# Patient Record
Sex: Female | Born: 1975 | Race: White | Hispanic: No | Marital: Married | Smoking: Former smoker
Health system: Southern US, Community
[De-identification: ages and names within clinical notes are randomized; demographics above are authoritative.]

## PROBLEM LIST (undated history)

## (undated) DIAGNOSIS — T7840XA Allergy, unspecified, initial encounter: Secondary | ICD-10-CM

## (undated) DIAGNOSIS — F419 Anxiety disorder, unspecified: Secondary | ICD-10-CM

## (undated) DIAGNOSIS — K219 Gastro-esophageal reflux disease without esophagitis: Secondary | ICD-10-CM

## (undated) HISTORY — PX: FRACTURE SURGERY: SHX138

## (undated) HISTORY — PX: LITHOTRIPSY: SUR834

## (undated) HISTORY — PX: AUGMENTATION MAMMAPLASTY: SUR837

## (undated) HISTORY — DX: Allergy, unspecified, initial encounter: T78.40XA

## (undated) HISTORY — PX: TONSILLECTOMY: SUR1361

## (undated) HISTORY — DX: Gastro-esophageal reflux disease without esophagitis: K21.9

---

## 2000-01-11 ENCOUNTER — Encounter: Payer: Self-pay | Admitting: Emergency Medicine

## 2000-01-11 ENCOUNTER — Emergency Department (HOSPITAL_COMMUNITY): Admission: EM | Admit: 2000-01-11 | Discharge: 2000-01-11 | Payer: Self-pay | Admitting: Emergency Medicine

## 2000-12-23 ENCOUNTER — Other Ambulatory Visit: Admission: RE | Admit: 2000-12-23 | Discharge: 2000-12-23 | Payer: Self-pay | Admitting: Obstetrics and Gynecology

## 2001-07-20 ENCOUNTER — Inpatient Hospital Stay (HOSPITAL_COMMUNITY): Admission: AD | Admit: 2001-07-20 | Discharge: 2001-07-23 | Payer: Self-pay | Admitting: Obstetrics and Gynecology

## 2002-04-21 ENCOUNTER — Other Ambulatory Visit: Admission: RE | Admit: 2002-04-21 | Discharge: 2002-04-21 | Payer: Self-pay | Admitting: Obstetrics and Gynecology

## 2003-04-03 ENCOUNTER — Other Ambulatory Visit: Admission: RE | Admit: 2003-04-03 | Discharge: 2003-04-03 | Payer: Self-pay | Admitting: Obstetrics and Gynecology

## 2004-04-03 ENCOUNTER — Other Ambulatory Visit: Admission: RE | Admit: 2004-04-03 | Discharge: 2004-04-03 | Payer: Self-pay | Admitting: Obstetrics and Gynecology

## 2004-10-22 ENCOUNTER — Inpatient Hospital Stay (HOSPITAL_COMMUNITY): Admission: AD | Admit: 2004-10-22 | Discharge: 2004-10-22 | Payer: Self-pay | Admitting: Obstetrics and Gynecology

## 2005-05-01 ENCOUNTER — Inpatient Hospital Stay (HOSPITAL_COMMUNITY): Admission: AD | Admit: 2005-05-01 | Discharge: 2005-05-03 | Payer: Self-pay | Admitting: Obstetrics and Gynecology

## 2005-06-10 ENCOUNTER — Other Ambulatory Visit: Admission: RE | Admit: 2005-06-10 | Discharge: 2005-06-10 | Payer: Self-pay | Admitting: Obstetrics and Gynecology

## 2006-03-20 ENCOUNTER — Emergency Department (HOSPITAL_COMMUNITY): Admission: EM | Admit: 2006-03-20 | Discharge: 2006-03-21 | Payer: Self-pay | Admitting: Emergency Medicine

## 2006-06-16 ENCOUNTER — Other Ambulatory Visit: Admission: RE | Admit: 2006-06-16 | Discharge: 2006-06-16 | Payer: Self-pay | Admitting: Obstetrics and Gynecology

## 2007-02-16 ENCOUNTER — Inpatient Hospital Stay (HOSPITAL_COMMUNITY): Admission: AD | Admit: 2007-02-16 | Discharge: 2007-02-16 | Payer: Self-pay | Admitting: Obstetrics and Gynecology

## 2007-06-07 ENCOUNTER — Encounter (INDEPENDENT_AMBULATORY_CARE_PROVIDER_SITE_OTHER): Payer: Self-pay | Admitting: Obstetrics and Gynecology

## 2007-06-07 ENCOUNTER — Inpatient Hospital Stay (HOSPITAL_COMMUNITY): Admission: AD | Admit: 2007-06-07 | Discharge: 2007-06-09 | Payer: Self-pay | Admitting: Obstetrics and Gynecology

## 2009-06-23 ENCOUNTER — Emergency Department (HOSPITAL_COMMUNITY): Admission: EM | Admit: 2009-06-23 | Discharge: 2009-06-23 | Payer: Self-pay | Admitting: Emergency Medicine

## 2009-09-07 ENCOUNTER — Emergency Department (HOSPITAL_COMMUNITY): Admission: EM | Admit: 2009-09-07 | Discharge: 2009-09-08 | Payer: Self-pay | Admitting: Emergency Medicine

## 2009-09-13 ENCOUNTER — Ambulatory Visit (HOSPITAL_COMMUNITY): Admission: RE | Admit: 2009-09-13 | Discharge: 2009-09-13 | Payer: Self-pay | Admitting: Urology

## 2010-01-21 ENCOUNTER — Emergency Department (HOSPITAL_COMMUNITY): Admission: EM | Admit: 2010-01-21 | Discharge: 2010-01-21 | Payer: Self-pay | Admitting: Family Medicine

## 2010-02-13 ENCOUNTER — Emergency Department (HOSPITAL_COMMUNITY): Admission: EM | Admit: 2010-02-13 | Discharge: 2010-02-13 | Payer: Self-pay | Admitting: Family Medicine

## 2010-04-27 ENCOUNTER — Emergency Department (HOSPITAL_COMMUNITY): Admission: EM | Admit: 2010-04-27 | Discharge: 2010-04-27 | Payer: Self-pay | Admitting: Emergency Medicine

## 2010-11-07 IMAGING — CR DG ABDOMEN 1V
1 series · 1 of 1 positions shown · non-contrast
Comparison: CT 09/08/2009

CLINICAL DATA: Pre lithotripsy.  Left renal stone.

ABDOMEN - 1 VIEW

[t abdomen supine]
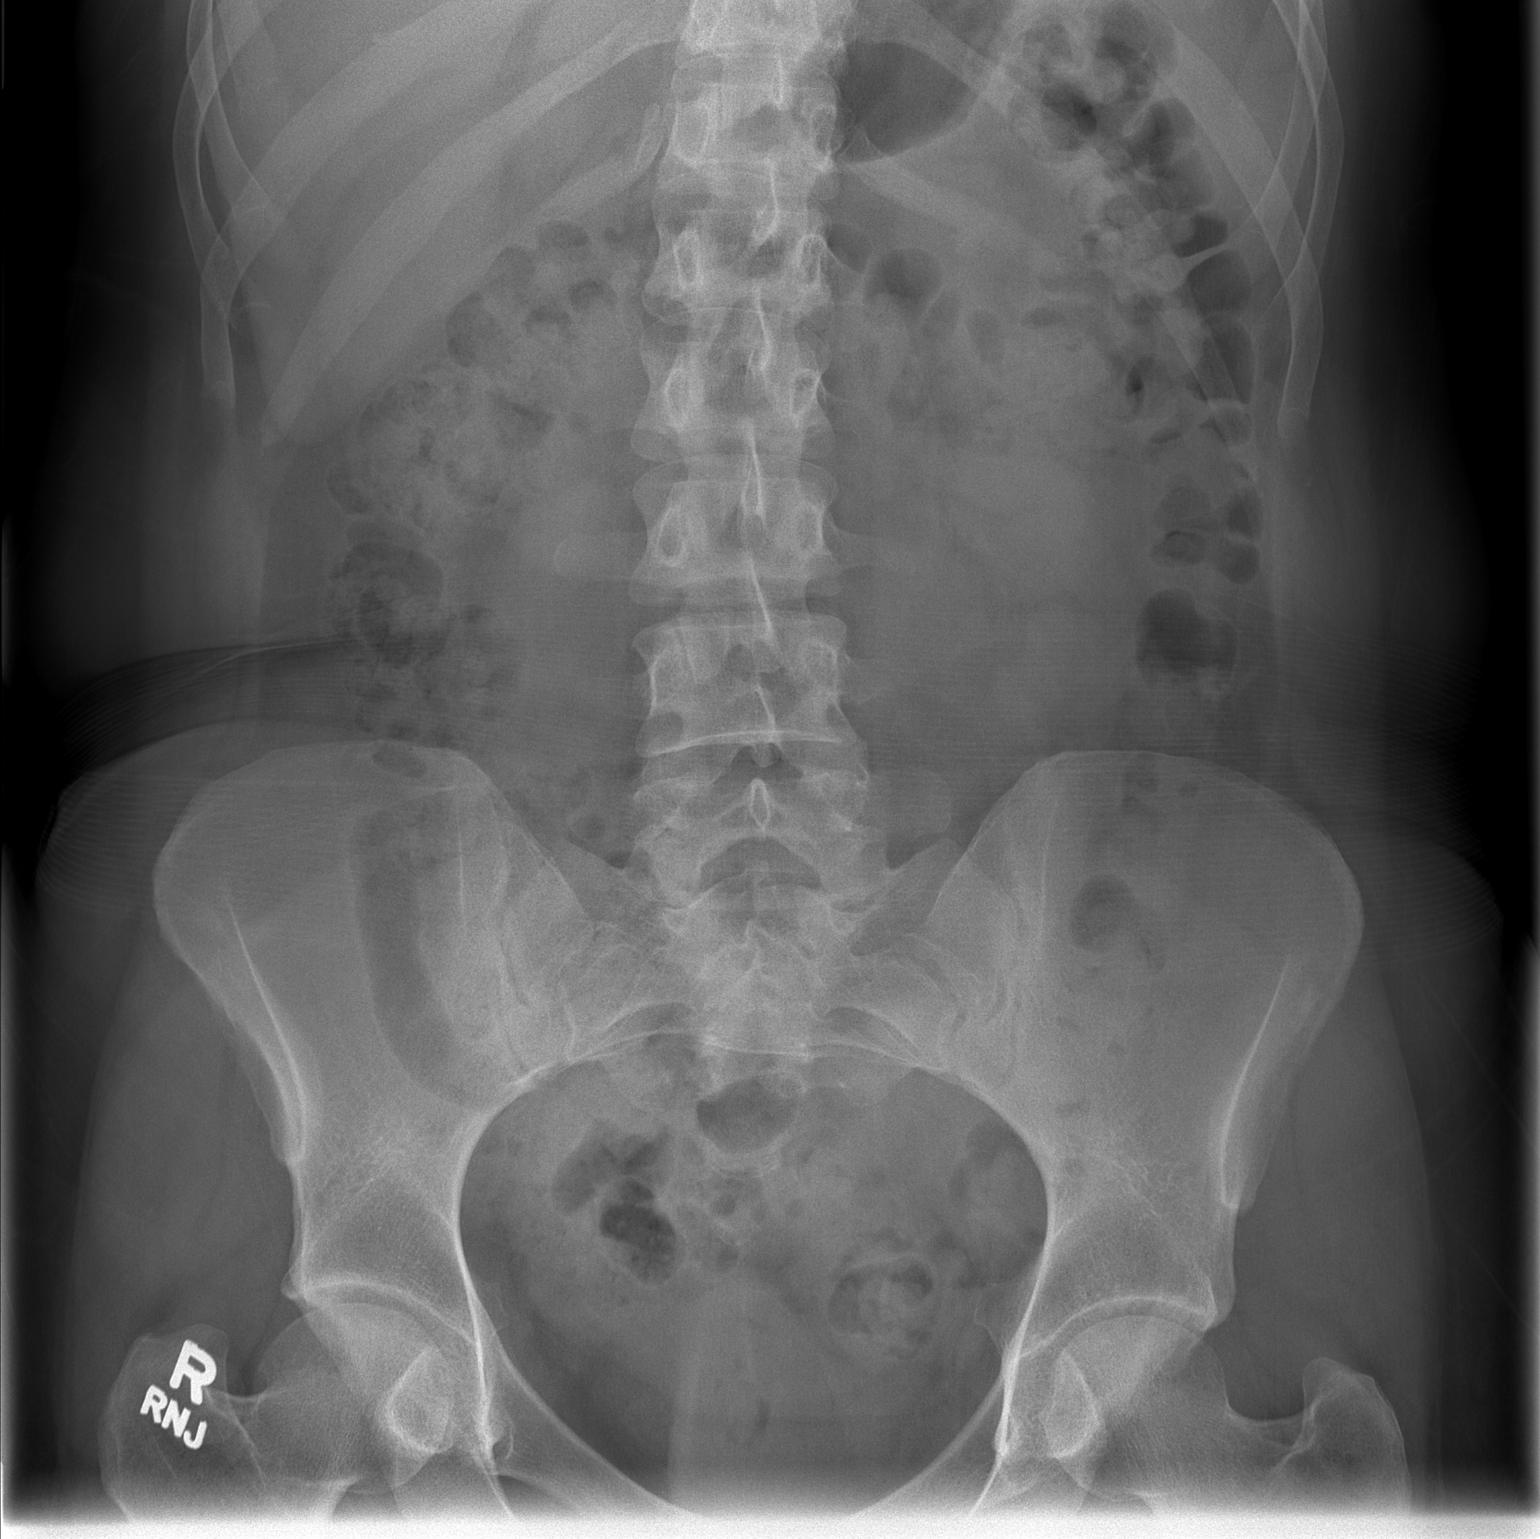

[1 of 1 positions shown; findings below may reference images not displayed]

FINDINGS: No distinct urinary tract calculus is appreciated.  Bowel
gas pattern is unremarkable.
IMPRESSION: No evidence of a urinary tract calculus.

## 2010-12-01 ENCOUNTER — Inpatient Hospital Stay (INDEPENDENT_AMBULATORY_CARE_PROVIDER_SITE_OTHER)
Admission: RE | Admit: 2010-12-01 | Discharge: 2010-12-01 | Disposition: A | Discharge status: Home / Self Care | Payer: Commercial Managed Care - PPO | Source: Ambulatory Visit | Attending: Family Medicine | Admitting: Family Medicine

## 2010-12-01 DIAGNOSIS — G542 Cervical root disorders, not elsewhere classified: Secondary | ICD-10-CM

## 2010-12-06 ENCOUNTER — Emergency Department (HOSPITAL_COMMUNITY)
Admission: EM | Admit: 2010-12-06 | Discharge: 2010-12-07 | Disposition: A | Discharge status: Home / Self Care | Payer: Commercial Managed Care - PPO | Attending: Emergency Medicine | Admitting: Emergency Medicine

## 2010-12-06 DIAGNOSIS — K089 Disorder of teeth and supporting structures, unspecified: Secondary | ICD-10-CM | POA: Insufficient documentation

## 2010-12-06 DIAGNOSIS — Z87442 Personal history of urinary calculi: Secondary | ICD-10-CM | POA: Insufficient documentation

## 2010-12-20 ENCOUNTER — Inpatient Hospital Stay (INDEPENDENT_AMBULATORY_CARE_PROVIDER_SITE_OTHER)
Admission: RE | Admit: 2010-12-20 | Discharge: 2010-12-20 | Disposition: A | Discharge status: Home / Self Care | Payer: Commercial Managed Care - PPO | Source: Ambulatory Visit | Attending: Emergency Medicine | Admitting: Emergency Medicine

## 2010-12-20 DIAGNOSIS — M546 Pain in thoracic spine: Secondary | ICD-10-CM

## 2010-12-26 ENCOUNTER — Emergency Department (HOSPITAL_COMMUNITY): Payer: Commercial Managed Care - PPO

## 2010-12-26 ENCOUNTER — Emergency Department (HOSPITAL_COMMUNITY)
Admission: EM | Admit: 2010-12-26 | Discharge: 2010-12-27 | Disposition: A | Discharge status: Home / Self Care | Payer: Commercial Managed Care - PPO | Attending: Emergency Medicine | Admitting: Emergency Medicine

## 2010-12-26 DIAGNOSIS — N739 Female pelvic inflammatory disease, unspecified: Secondary | ICD-10-CM | POA: Insufficient documentation

## 2010-12-26 DIAGNOSIS — Z87442 Personal history of urinary calculi: Secondary | ICD-10-CM | POA: Insufficient documentation

## 2010-12-26 DIAGNOSIS — R110 Nausea: Secondary | ICD-10-CM | POA: Insufficient documentation

## 2010-12-26 DIAGNOSIS — R109 Unspecified abdominal pain: Secondary | ICD-10-CM | POA: Insufficient documentation

## 2010-12-26 DIAGNOSIS — R319 Hematuria, unspecified: Secondary | ICD-10-CM | POA: Insufficient documentation

## 2010-12-26 LAB — URINALYSIS, ROUTINE W REFLEX MICROSCOPIC
Bilirubin Urine: NEGATIVE
Ketones, ur: NEGATIVE mg/dL
Leukocytes, UA: NEGATIVE
Nitrite: NEGATIVE
Urobilinogen, UA: 0.2 mg/dL (ref 0.0–1.0)
pH: 6.5 (ref 5.0–8.0)

## 2010-12-26 LAB — WET PREP, GENITAL
Clue Cells Wet Prep HPF POC: NONE SEEN
Trich, Wet Prep: NONE SEEN

## 2010-12-27 LAB — GC/CHLAMYDIA PROBE AMP, GENITAL
Chlamydia, DNA Probe: NEGATIVE
GC Probe Amp, Genital: NEGATIVE

## 2010-12-27 LAB — URINE CULTURE

## 2011-01-29 LAB — URINE CULTURE: Colony Count: >=100000

## 2011-01-29 LAB — URINE MICROSCOPIC-ADD ON

## 2011-01-29 LAB — URINALYSIS, ROUTINE W REFLEX MICROSCOPIC
Glucose, UA: NEGATIVE mg/dL
Nitrite: POSITIVE — AB
Specific Gravity, Urine: 1.019 (ref 1.005–1.030)
pH: 6 (ref 5.0–8.0)

## 2011-01-29 LAB — PREGNANCY, URINE
Preg Test, Ur: NEGATIVE
Preg Test, Ur: NEGATIVE
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
## 2011-03-14 NOTE — H&P (Signed)
[facility]  Patient:    [name], [name] Visit Number: [id number] MRN: [id number]          Service Type: OBS Location: 910B [id number] [id number] Attending Physician:  Shaune Spittle Dictated by:   Nigel Bridgeman, C.N.M. Admit Date:  07/20/2001                           History and Physical  HISTORY OF PRESENT ILLNESS:   The patient is a 35 year old gravida 1, para 0, at 39-1/7 weeks who presents with spontaneous rupture of membranes since approximately 3:45 a.m.  She reports clear fluid and occasional perception of uterine contractions.  Pregnancy has been remarkable for:  1. Equivocal rubella. 2. First trimester drug exposure. 3. History of cryo. 4. BV first trimester. 5. Positive beta strep. 6. History of kidney stones.  PRENATAL LABORATORY DATA:     Blood type is B-positive.  Rh antibody negative. VDRL nonreactive.  Rubella titer is equivocal.  Hepatitis B surface antigen is negative.  HIV nonreactive.  Pap is normal.  GC and Chlamydia cultures were negative.  Glucola challenge was normal.  AFP was not documented in the chart. Group B strep culture was positive at 36 weeks.  EDC of July 26, 2001 was established by ultrasound at approximately 16 weeks.  LMP dating was July 15, 2001, but this was adjusted based upon 16 week and later ultrasound.  HISTORY OF PRESENT PREGNANCY: The patient entered care at approximately 10 weeks.  She had BV noted at that visit and was treated.  She had a yeast infection treated at 15 weeks.  She had an ultrasound at 16 weeks that showed a marginal previa that resolve on follow up ultrasound at 20 weeks.  She did have some edema in the third trimester, but no hypertension.  Beta strep was noted to be positive at 36 weeks.  OBSTETRICAL HISTORY:          The patient is a primigravida.  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  She  conceived on oral contraceptives in 1994.  She had an abnormal Pap with CIN-2 and 3.  She had colposcopy and cryosurgery.  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  She had a history of frequent yeast infections until she changed OCPs.  She has a history of anemia in the past and was placed on iron.  She has had UTIs one or two times a year. She was hospitalized once for pyelonephritis as a child.  She has had one kidney stone every year.  She was a smoker until her positive UPT.  PAST SURGICAL HISTORY:        Includes tonsillectomy and had postoperative bleeding.  She had a history of cryo and colposcopy.  The patient was born with a heart murmur, but out grew it.  ALLERGIES:                    SENSITIVITY TO ASPIRIN WHICH UPSETS HER STOMACH.  FAMILY HISTORY:               Her mother has hypertension.  Her maternal grandmother and mother had varicosities.  Her father had COPD.  Her maternal uncle had diabetes.  Mother is a type 2 diabetic.  Maternal grandmothers half sister had a kidney transplant.  Maternal grandmother had  lymphocytic cancer and had metastases.  Mother has a history of severe depression, has been on medication, and has been hospitalized in the past.  Father is an alcohol user. Mother, maternal grandmother, and maternal grandfather also are alcohol users.  GENETIC HISTORY:              Remarkable for the father of the baby born with a heart murmur.  SOCIAL HISTORY:               The patient is single.  The father of the baby is involved and supportive.  The patient has her GED plus one year of college. Her partner has two years of trade school and is employed as a Psychologist, occupational.  She has been followed by the certified nurse midwife service at [facility].  She had some first trimester exposure to marijuana which she stopped in February.  She also was a 1-1/2-pack-per-day smoker until her positive UPT.  PHYSICAL EXAMINATION:  VITAL SIGNS:                  Stable and  the patient is afebrile.  HEENT:                        Within normal limits.  LUNGS:                        Bilateral breath sounds are clear.  HEART:                        Regular rate and rhythm without murmur.  BREASTS:                      Soft and nontender.  ABDOMEN:                      Fundal height was approximately 38 cm. Estimated fetal weight is 7-1/2 pounds.  Uterine contractions are every two to four minutes, mild quality.  CERVICAL EXAMINATION:         One centimeter, 70% vertex at a -1 station with clear fluid noted that is positive nitrazine and positive fern.  Fetal heart rate is reassuring, but no decelerations.  There is a negative spontaneous CST.  EXTREMITIES:                  Deep tendon reflexes are 1-2+ without clonus. There is a trace edema noted.  IMPRESSION:                   1. Intrauterine pregnancy at 39-1/7 weeks.                               2. Spontaneous rupture of membranes.                               3. Early labor.                               4. Positive group B streptococcus.                               5. History of cryosurgery.  6. Equivocal rubella.  PLAN:                         1. Admit to birthing suite for consult with                                  Dr. Dierdre Forth and Dr. _____ as on call                                  physicians.                               2. Routine physician orders.                               3. Plan penicillin G for prophylaxis for beta                                  strep.                               4. Reviewed Pitocin versus expectant management.                                  The patient at this point wishes expectant                                  management. Dictated by:   Nigel Bridgeman, C.N.M. Attending Physician:  Shaune Spittle DD:  07/20/01 TD:  07/20/01 Job: [id number]
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
## 2011-08-11 LAB — CBC
HCT: 29.6 — ABNORMAL LOW
HCT: 32.8 — ABNORMAL LOW
Hemoglobin: 10.3 — ABNORMAL LOW
Hemoglobin: 9.5 — ABNORMAL LOW
MCHC: 33.4
MCHC: 34.9
MCV: 85.4
Platelets: 366
RBC: 3.35 — ABNORMAL LOW
RBC: 3.5 — ABNORMAL LOW
RBC: 3.89
RDW: 14.4 — ABNORMAL HIGH
WBC: 13.1 — ABNORMAL HIGH

## 2011-09-09 ENCOUNTER — Emergency Department (HOSPITAL_COMMUNITY): Payer: Commercial Managed Care - PPO

## 2011-09-09 ENCOUNTER — Encounter: Payer: Self-pay | Admitting: Emergency Medicine

## 2011-09-09 ENCOUNTER — Emergency Department (HOSPITAL_COMMUNITY)
Admission: EM | Admit: 2011-09-09 | Discharge: 2011-09-10 | Disposition: A | Discharge status: Home / Self Care | Payer: Commercial Managed Care - PPO | Attending: Emergency Medicine | Admitting: Emergency Medicine

## 2011-09-09 DIAGNOSIS — Y92009 Unspecified place in unspecified non-institutional (private) residence as the place of occurrence of the external cause: Secondary | ICD-10-CM | POA: Insufficient documentation

## 2011-09-09 DIAGNOSIS — S42309A Unspecified fracture of shaft of humerus, unspecified arm, initial encounter for closed fracture: Secondary | ICD-10-CM | POA: Insufficient documentation

## 2011-09-09 DIAGNOSIS — W010XXA Fall on same level from slipping, tripping and stumbling without subsequent striking against object, initial encounter: Secondary | ICD-10-CM | POA: Insufficient documentation

## 2011-09-09 MED ORDER — MORPHINE SULFATE 4 MG/ML IJ SOLN
4.0000 mg | Freq: Once | INTRAMUSCULAR | Status: AC
  Administered 2011-09-09: 4 mg via INTRAMUSCULAR
  Filled 2011-09-09: qty 1

## 2011-09-09 MED ORDER — OXYCODONE-ACETAMINOPHEN 5-325 MG PO TABS
2.0000 | ORAL_TABLET | Freq: Once | ORAL | Status: AC
  Administered 2011-09-09: 2 via ORAL
  Filled 2011-09-09: qty 2

## 2011-09-09 MED ORDER — ONDANSETRON 8 MG PO TBDP
8.0000 mg | ORAL_TABLET | Freq: Once | ORAL | Status: AC
  Administered 2011-09-09: 8 mg via ORAL
  Filled 2011-09-09: qty 1

## 2011-09-09 NOTE — ED Notes (Signed)
Slipped on a rug and fell. Hurt her upper arm.

## 2011-09-10 ENCOUNTER — Other Ambulatory Visit (HOSPITAL_COMMUNITY): Payer: Self-pay | Admitting: Orthopaedic Surgery

## 2011-09-10 DIAGNOSIS — S42309A Unspecified fracture of shaft of humerus, unspecified arm, initial encounter for closed fracture: Secondary | ICD-10-CM | POA: Diagnosis present

## 2011-09-10 MED ORDER — LORAZEPAM 1 MG PO TABS
1.0000 mg | ORAL_TABLET | Freq: Once | ORAL | Status: AC
  Administered 2011-09-10: 1 mg via ORAL
  Filled 2011-09-10: qty 1

## 2011-09-10 MED ORDER — OXYCODONE-ACETAMINOPHEN 5-325 MG PO TABS: 2.0000 | ORAL_TABLET | ORAL | Status: DC | PRN

## 2011-09-10 NOTE — ED Provider Notes (Signed)
History     CSN: [id number] Arrival date & time: 09/09/2011 10:27 PM   First MD Initiated Contact with Patient 09/09/11 2229      Chief Complaint  Patient presents with  . Fall    Slipped on on a rug and fell. Hurt her upper arm    (Consider location/radiation/quality/duration/timing/severity/associated sxs/prior treatment) Patient is a 35 y.o. female presenting with arm injury.  Arm Injury  The incident occurred just prior to arrival. The injury mechanism was a fall.    Pt was walking in home and slipped and fell injuring her left arm. She says that she notices her arm was turned the wrong way, she feels something grinding in her arm, and it is excruciatingly painful. She denies any head injury or LOC.  History reviewed. No pertinent past medical history.  Past Surgical History  Procedure Date  . Tonsillectomy     Family History  Problem Relation Age of Onset  . Rheum arthritis Mother   . Diabetes Mother   . Heart failure Mother     History  Substance Use Topics  . Smoking status: Former Games developer  . Smokeless tobacco: Never Used  . Alcohol Use: 0.6 oz/week    1 Glasses of wine per week    OB History    Grav Para Term Preterm Abortions TAB SAB Ect Mult Living                  Review of Systems  All other systems reviewed and are negative.    Allergies  Aspirin  Home Medications   Current Outpatient Rx  Name Route Sig Dispense Refill  . CALCIUM CARBONATE-VITAMIN D 500-200 MG-UNIT PO TABS Oral Take 2 tablets by mouth daily.      Marland Kitchen MEDROXYPROGESTERONE ACETATE 150 MG/ML IM SUSP Intramuscular Inject 150 mg into the muscle every 3 (three) months. Last received injection the end of October 2012     . ONE-DAILY MULTI VITAMINS PO TABS Oral Take 1 tablet by mouth daily.      . TRAMADOL HCL 50 MG PO TABS Oral Take 50 mg by mouth every 6 (six) hours as needed. Pain.Marland KitchenMarland KitchenMaximum dose= 8 tablets per day       BP 113/65  Pulse 107  Temp(Src) 99.1 F (37.3 C) (Oral)   Resp 18  SpO2 100%  Physical Exam  Constitutional: She appears well-developed and well-nourished.  HENT:  Head: Normocephalic and atraumatic.  Eyes: Conjunctivae are normal. Pupils are equal, round, and reactive to light.  Neck: Trachea normal, normal range of motion and full passive range of motion without pain. Neck supple.  Cardiovascular: Normal rate, regular rhythm and normal pulses.   Pulmonary/Chest: Effort normal and breath sounds normal. Chest wall is not dull to percussion. She exhibits no tenderness, no crepitus, no edema, no deformity and no retraction.  Abdominal: Soft. Normal appearance and bowel sounds are normal.  Musculoskeletal:       Arms: Lymphadenopathy:       Head (right side): No submental, no submandibular, no tonsillar, no preauricular, no posterior auricular and no occipital adenopathy present.       Head (left side): No submental, no submandibular, no tonsillar, no preauricular, no posterior auricular and no occipital adenopathy present.    She has no cervical adenopathy.    She has no axillary adenopathy.  Neurological: She is alert. She has normal strength.  Skin: Skin is warm, dry and intact.  Psychiatric: She has a normal mood and affect. Her speech  is normal and behavior is normal. Judgment and thought content normal. Cognition and memory are normal.    ED Course  Procedures (including critical care time)  Labs Reviewed - No data to display Dg Shoulder Left  09/09/2011  *RADIOLOGY REPORT*  Clinical Data: 35 year old female status post fall with pain.  LEFT SHOULDER - 2+ VIEW  Comparison: Left humerus series from the same day.  Findings: Bone mineralization is within normal limits. No glenohumeral joint dislocation.  Left clavicle and scapula appear intact.  Distal left humerus fracture re-identified.  No pneumothorax.  Irregularity of the lateral left fifth and sixth ribs on the AP view does not persist on the scapular Y view.  IMPRESSION: No acute  fracture or dislocation identified at the left shoulder. Distal left humerus fracture re-identified.  Original Report Authenticated By: Harley Hallmark, M.D.   Dg Humerus Left  09/09/2011  *RADIOLOGY REPORT*  Clinical Data: 35 year old female status post fall with pain.  LEFT HUMERUS - 2+ VIEW  Comparison: None.  Findings: Comminuted distal third left humerus shaft fracture with medially displaced butterfly fragment.  Otherwise near anatomic alignment.  Spiral type fracture lucency extends as far as the distal shaft, proximal aspect of the distal metadiaphysis.  Visualized ribs appear grossly intact.  Negative visualized lung parenchyma.  Alignment at the left shoulder appears intact.  IMPRESSION: Comminuted, spiral distal left humerus shaft fracture with medially displaced butterfly fragment.  Original Report Authenticated By: Harley Hallmark, M.D.     No diagnosis found.    MDM  Spoke with doctor yates who has instructed me to put an elephant splint and posterior splint with a across the chest arm sling.        Dorthula Matas, PA 09/10/11 0110

## 2011-09-10 NOTE — H&P (Signed)
[name] is an 35 y.o. female.   Chief Complaint: left arm fracture HPI: Patient fell and injured left arm.  Seen in Dr Ophelia Charter office for evaluation.  Radiographs reviewed and patient will require open reduction and internal fixation of left mid shaft humerus fracture.  Patient discussed risk and benefits of procedure with Dr Ophelia Charter and wishes to proceed. Plan for surgery with overnight stay.  No past medical history on file.   Past Surgical History  Procedure Date  . Tonsillectomy     Family History  Problem Relation Age of Onset  . Rheum arthritis Mother   . Diabetes Mother   . Heart failure Mother    Social History:  reports that she has quit smoking. She has never used smokeless tobacco. She reports that she drinks about .6 ounces of alcohol per week. She reports that she does not use illicit drugs.  Allergies:  Allergies  Allergen Reactions  . Aspirin Other (See Comments)    Upset stomach    Medications Prior to Admission  Medication Dose Route Frequency Provider Last Rate Last Dose  . LORazepam (ATIVAN) tablet 1 mg  1 mg Oral Once Dorthula Matas, PA   1 mg at 09/10/11 0022  . morphine 4 MG/ML injection 4 mg  4 mg Intramuscular Once Dorthula Matas, PA   4 mg at 09/09/11 2350  . ondansetron (ZOFRAN-ODT) disintegrating tablet 8 mg  8 mg Oral Once Dorthula Matas, PA   8 mg at 09/09/11 2349  . oxyCODONE-acetaminophen (PERCOCET) 5-325 MG per tablet 2 tablet  2 tablet Oral Once Dorthula Matas, PA   2 tablet at 09/09/11 2251   Medications Prior to Admission  Medication Sig Dispense Refill  . calcium-vitamin D (OSCAL WITH D) 500-200 MG-UNIT per tablet Take 2 tablets by mouth daily.        . medroxyPROGESTERone (DEPO-PROVERA) 150 MG/ML injection Inject 150 mg into the muscle every 3 (three) months. Last received injection the end of October 2012       . Multiple Vitamin (MULTIVITAMIN) tablet Take 1 tablet by mouth daily.        Marland Kitchen oxyCODONE-acetaminophen (PERCOCET) 5-325  MG per tablet Take 2 tablets by mouth every 4 (four) hours as needed for pain.  15 tablet  0  . traMADol (ULTRAM) 50 MG tablet Take 50 mg by mouth every 6 (six) hours as needed. Pain.Marland KitchenMarland KitchenMaximum dose= 8 tablets per day         No results found for this or any previous visit (from the past 48 hour(s)). Dg Shoulder Left  09/09/2011  *RADIOLOGY REPORT*  Clinical Data: 35 year old female status post fall with pain.  LEFT SHOULDER - 2+ VIEW  Comparison: Left humerus series from the same day.  Findings: Bone mineralization is within normal limits. No glenohumeral joint dislocation.  Left clavicle and scapula appear intact.  Distal left humerus fracture re-identified.  No pneumothorax.  Irregularity of the lateral left fifth and sixth ribs on the AP view does not persist on the scapular Y view.  IMPRESSION: No acute fracture or dislocation identified at the left shoulder. Distal left humerus fracture re-identified.  Original Report Authenticated By: Harley Hallmark, M.D.   Dg Humerus Left  09/09/2011  *RADIOLOGY REPORT*  Clinical Data: 35 year old female status post fall with pain.  LEFT HUMERUS - 2+ VIEW  Comparison: None.  Findings: Comminuted distal third left humerus shaft fracture with medially displaced butterfly fragment.  Otherwise near anatomic alignment.  Spiral type  fracture lucency extends as far as the distal shaft, proximal aspect of the distal metadiaphysis.  Visualized ribs appear grossly intact.  Negative visualized lung parenchyma.  Alignment at the left shoulder appears intact.  IMPRESSION: Comminuted, spiral distal left humerus shaft fracture with medially displaced butterfly fragment.  Original Report Authenticated By: Harley Hallmark, M.D.    Review of Systems  Constitutional: Negative.   HENT: Negative.   Eyes: Negative.   Respiratory: Negative.   Cardiovascular: Negative.   Gastrointestinal: Negative.   Genitourinary: Negative.   Musculoskeletal:       Pain in left arm  Tolerating  the splint well  Skin: Negative.   Neurological: Negative.   Endo/Heme/Allergies: Negative.   Psychiatric/Behavioral: Negative.     There were no vitals taken for this visit. Physical Exam  Constitutional: She is oriented to person, place, and time. She appears well-developed and well-nourished.  HENT:  Head: Normocephalic and atraumatic.  Right Ear: External ear normal.  Left Ear: External ear normal.  Mouth/Throat: Oropharynx is clear and moist.  Eyes: EOM are normal. Pupils are equal, round, and reactive to light.  Neck: Normal range of motion. Neck supple.  Cardiovascular: Normal heart sounds.   Respiratory: Breath sounds normal.  GI: Soft. Bowel sounds are normal.  Musculoskeletal:       Left arm in splint and sling.  Radial nerve function intact.  Cap refill of fingers intact.  Sensation left hand intact   Neurological: She is alert and oriented to person, place, and time.  Skin: Skin is warm and dry.  Psychiatric: She has a normal mood and affect.     Assessment/Plan; Left mid shaft humerus fracture. Proceed with ORIF Left humeral shaft fracture. Overnight hospital stay. Follow up in office one week post op  Naheim Burgen M 09/10/2011, 11:40 AM

## 2011-09-10 NOTE — ED Provider Notes (Signed)
Medical screening examination/treatment/procedure(s) were performed by non-physician practitioner and as supervising physician I was immediately available for consultation/collaboration.  Donnetta Hutching, MD 09/10/11 [phone]

## 2011-09-11 ENCOUNTER — Encounter (HOSPITAL_COMMUNITY)
Admission: RE | Admit: 2011-09-11 | Discharge: 2011-09-11 | Disposition: A | Discharge status: Home / Self Care | Payer: Commercial Managed Care - PPO | Source: Ambulatory Visit | Attending: Orthopaedic Surgery | Admitting: Orthopaedic Surgery

## 2011-09-11 ENCOUNTER — Encounter (HOSPITAL_COMMUNITY): Payer: Self-pay | Admitting: Pharmacy Technician

## 2011-09-11 ENCOUNTER — Encounter (HOSPITAL_COMMUNITY): Payer: Self-pay

## 2011-09-11 HISTORY — DX: Anxiety disorder, unspecified: F41.9

## 2011-09-11 LAB — COMPREHENSIVE METABOLIC PANEL
AST: 18 U/L (ref 0–37)
CO2: 24 mEq/L (ref 19–32)
Chloride: 104 mEq/L (ref 96–112)
Creatinine, Ser: 0.96 mg/dL (ref 0.50–1.10)
GFR calc non Af Amer: 75 mL/min — ABNORMAL LOW (ref >90)
Total Bilirubin: 0.3 mg/dL (ref 0.3–1.2)

## 2011-09-11 LAB — CBC
HCT: 39.1 % (ref 36.0–46.0)
MCV: 93.8 fL (ref 78.0–100.0)
Platelets: 251 10*3/uL (ref 150–400)
RBC: 4.17 MIL/uL (ref 3.87–5.11)
WBC: 14 10*3/uL — ABNORMAL HIGH (ref 4.0–10.5)

## 2011-09-11 LAB — PROTIME-INR: INR: 1.08 (ref 0.00–1.49)

## 2011-09-11 MED ORDER — CEFAZOLIN SODIUM 1-5 GM-% IV SOLN
1.0000 g | Freq: Once | INTRAVENOUS | Status: DC
  Filled 2011-09-11: qty 50

## 2011-09-11 MED ORDER — CEFAZOLIN SODIUM 1-5 GM-% IV SOLN: 1.0000 g | INTRAVENOUS | Status: DC

## 2011-09-11 MED ORDER — CHLORHEXIDINE GLUCONATE 4 % EX LIQD: 60.0000 mL | Freq: Once | CUTANEOUS | Status: DC

## 2011-09-11 NOTE — Pre-Procedure Instructions (Signed)
20 [name]  09/11/2011   Your procedure is scheduled on:  09-12-2011  Report to [facility] at 9:30 AM.  Call this number if you have problems the morning of surgery: [phone]   Remember:   Do not eat food:After Midnight.  Do not drink clear liquids: 4 Hours before arrival.MAY HAVE WATER,TEA, BROTH,SODA ,BLACK COFFEE,APPLE JUICE OR GRAPE JUICE UNTIL 5:30 AM  Take these medicines the morning of surgery with A SIP OF WATER PAIN MEDICATION   Do not wear jewelry, make-up or nail polish.  Do not wear lotions, powders, or perfumes. You may wear deodorant.  Do not shave 48 hours prior to surgery.  Do not bring valuables to the hospital.  Contacts, dentures or bridgework may not be worn into surgery.  Leave suitcase in the car. After surgery it may be brought to your room.  For patients admitted to the hospital, checkout time is 11:00 AM the day of discharge.   Patients discharged the day of surgery will not be allowed to drive home.  Name and phone number of your driver:   Special Instructions: CHG Shower Use Special Wash: 1/2 bottle night before surgery and 1/2 bottle morning of surgery.   Please read over the following fact sheets that you were given: MRSA Information and Surgical Site Infection Prevention

## 2011-09-12 ENCOUNTER — Ambulatory Visit (HOSPITAL_COMMUNITY)
Admission: RE | Admit: 2011-09-12 | Discharge: 2011-09-14 | DRG: 494 | Disposition: A | Discharge status: Home / Self Care | Payer: Commercial Managed Care - PPO | Source: Ambulatory Visit | Attending: Orthopaedic Surgery | Admitting: Orthopaedic Surgery

## 2011-09-12 ENCOUNTER — Encounter (HOSPITAL_COMMUNITY): Payer: Self-pay | Admitting: Anesthesiology

## 2011-09-12 ENCOUNTER — Inpatient Hospital Stay (HOSPITAL_COMMUNITY): Payer: Commercial Managed Care - PPO

## 2011-09-12 ENCOUNTER — Inpatient Hospital Stay (HOSPITAL_COMMUNITY): Payer: Commercial Managed Care - PPO | Admitting: Anesthesiology

## 2011-09-12 ENCOUNTER — Encounter (HOSPITAL_COMMUNITY)
Admission: RE | Disposition: A | Discharge status: Home / Self Care | Payer: Self-pay | Source: Ambulatory Visit | Attending: Orthopaedic Surgery

## 2011-09-12 DIAGNOSIS — S42309A Unspecified fracture of shaft of humerus, unspecified arm, initial encounter for closed fracture: Secondary | ICD-10-CM

## 2011-09-12 DIAGNOSIS — Y92009 Unspecified place in unspecified non-institutional (private) residence as the place of occurrence of the external cause: Secondary | ICD-10-CM | POA: Insufficient documentation

## 2011-09-12 DIAGNOSIS — W19XXXA Unspecified fall, initial encounter: Secondary | ICD-10-CM | POA: Insufficient documentation

## 2011-09-12 DIAGNOSIS — Z01812 Encounter for preprocedural laboratory examination: Secondary | ICD-10-CM | POA: Insufficient documentation

## 2011-09-12 HISTORY — PX: ORIF HUMERUS FRACTURE: SHX2126

## 2011-09-12 HISTORY — DX: Unspecified fracture of shaft of humerus, unspecified arm, initial encounter for closed fracture: S42.309A

## 2011-09-12 SURGERY — OPEN REDUCTION INTERNAL FIXATION (ORIF) PROXIMAL HUMERUS FRACTURE
Anesthesia: General | Laterality: Left

## 2011-09-12 MED ORDER — ZOLPIDEM TARTRATE 5 MG PO TABS: 5.0000 mg | ORAL_TABLET | Freq: Every evening | ORAL | Status: DC | PRN

## 2011-09-12 MED ORDER — OXYCODONE-ACETAMINOPHEN 5-325 MG PO TABS
1.0000 | ORAL_TABLET | ORAL | Status: DC | PRN
  Administered 2011-09-12 – 2011-09-13 (×5): 2 via ORAL
  Administered 2011-09-14: 1 via ORAL
  Administered 2011-09-14 (×2): 2 via ORAL
  Filled 2011-09-12 (×8): qty 2

## 2011-09-12 MED ORDER — ONDANSETRON HCL 4 MG/2ML IJ SOLN
INTRAMUSCULAR | Status: DC | PRN
  Administered 2011-09-12: 4 mg via INTRAVENOUS

## 2011-09-12 MED ORDER — DEXTROSE 5 % IV SOLN
500.0000 mg | Freq: Four times a day (QID) | INTRAVENOUS | Status: DC | PRN
  Administered 2011-09-12: 500 mg via INTRAVENOUS
  Filled 2011-09-12: qty 5

## 2011-09-12 MED ORDER — SENNOSIDES-DOCUSATE SODIUM 8.6-50 MG PO TABS: 1.0000 | ORAL_TABLET | Freq: Every evening | ORAL | Status: DC | PRN

## 2011-09-12 MED ORDER — HYDROMORPHONE HCL PF 1 MG/ML IJ SOLN
0.2500 mg | INTRAMUSCULAR | Status: DC | PRN
  Administered 2011-09-12 (×6): 0.5 mg via INTRAVENOUS

## 2011-09-12 MED ORDER — METOCLOPRAMIDE HCL 10 MG PO TABS: 5.0000 mg | ORAL_TABLET | Freq: Three times a day (TID) | ORAL | Status: DC | PRN

## 2011-09-12 MED ORDER — HYDROMORPHONE 0.3 MG/ML IV SOLN
INTRAVENOUS | Status: DC
  Administered 2011-09-12 – 2011-09-13 (×2): 7.5 mg via INTRAVENOUS
  Administered 2011-09-13 (×2): 1.8 mg via INTRAVENOUS
  Administered 2011-09-13: 2.7 mg via INTRAVENOUS
  Administered 2011-09-13: 7.5 mg via INTRAVENOUS
  Administered 2011-09-13: 1.8 mg via INTRAVENOUS
  Administered 2011-09-14: 3.3 mg via INTRAVENOUS
  Administered 2011-09-14: 7.5 mg via INTRAVENOUS
  Administered 2011-09-14: 3.3 mg via INTRAVENOUS
  Administered 2011-09-14: 1.8 mg via INTRAVENOUS
  Filled 2011-09-12 (×3): qty 25

## 2011-09-12 MED ORDER — MORPHINE SULFATE 2 MG/ML IJ SOLN: 0.0500 mg/kg | INTRAMUSCULAR | Status: DC | PRN

## 2011-09-12 MED ORDER — NALOXONE HCL 0.4 MG/ML IJ SOLN: 0.4000 mg | INTRAMUSCULAR | Status: DC | PRN

## 2011-09-12 MED ORDER — MIDAZOLAM HCL 2 MG/2ML IJ SOLN: 1.0000 mg | INTRAMUSCULAR | Status: DC | PRN

## 2011-09-12 MED ORDER — ROCURONIUM BROMIDE 100 MG/10ML IV SOLN
INTRAVENOUS | Status: DC | PRN
  Administered 2011-09-12: 40 mg via INTRAVENOUS
  Administered 2011-09-12: 10 mg via INTRAVENOUS

## 2011-09-12 MED ORDER — ONDANSETRON HCL 4 MG/2ML IJ SOLN: 4.0000 mg | Freq: Four times a day (QID) | INTRAMUSCULAR | Status: DC | PRN

## 2011-09-12 MED ORDER — DIPHENHYDRAMINE HCL 12.5 MG/5ML PO ELIX
12.5000 mg | ORAL_SOLUTION | ORAL | Status: DC | PRN
  Filled 2011-09-12: qty 10

## 2011-09-12 MED ORDER — FENTANYL CITRATE 0.05 MG/ML IJ SOLN
INTRAMUSCULAR | Status: DC | PRN
  Administered 2011-09-12: 100 ug via INTRAVENOUS
  Administered 2011-09-12: 50 ug via INTRAVENOUS
  Administered 2011-09-12: 100 ug via INTRAVENOUS

## 2011-09-12 MED ORDER — PROPOFOL 10 MG/ML IV EMUL
INTRAVENOUS | Status: DC | PRN
  Administered 2011-09-12: 150 mg via INTRAVENOUS

## 2011-09-12 MED ORDER — PROMETHAZINE HCL 25 MG/ML IJ SOLN: 6.2500 mg | INTRAMUSCULAR | Status: DC | PRN

## 2011-09-12 MED ORDER — METOCLOPRAMIDE HCL 5 MG/ML IJ SOLN
5.0000 mg | Freq: Three times a day (TID) | INTRAMUSCULAR | Status: DC | PRN
  Filled 2011-09-12: qty 2

## 2011-09-12 MED ORDER — HYDROMORPHONE HCL PF 1 MG/ML IJ SOLN
INTRAMUSCULAR | Status: AC
  Administered 2011-09-12: 0.5 mg via INTRAVENOUS
  Filled 2011-09-12: qty 1

## 2011-09-12 MED ORDER — KETOROLAC TROMETHAMINE 30 MG/ML IJ SOLN
30.0000 mg | Freq: Three times a day (TID) | INTRAMUSCULAR | Status: AC
  Administered 2011-09-12 – 2011-09-13 (×3): 30 mg via INTRAVENOUS
  Filled 2011-09-12 (×2): qty 1

## 2011-09-12 MED ORDER — LACTATED RINGERS IV SOLN
INTRAVENOUS | Status: DC
  Administered 2011-09-12: 11:00:00 via INTRAVENOUS

## 2011-09-12 MED ORDER — ONDANSETRON HCL 4 MG/2ML IJ SOLN
4.0000 mg | Freq: Four times a day (QID) | INTRAMUSCULAR | Status: DC | PRN
  Administered 2011-09-13: 4 mg via INTRAVENOUS
  Filled 2011-09-12: qty 2

## 2011-09-12 MED ORDER — ONDANSETRON HCL 4 MG PO TABS: 4.0000 mg | ORAL_TABLET | Freq: Four times a day (QID) | ORAL | Status: DC | PRN

## 2011-09-12 MED ORDER — HYDROMORPHONE HCL PF 1 MG/ML IJ SOLN
INTRAMUSCULAR | Status: AC
  Filled 2011-09-12: qty 1

## 2011-09-12 MED ORDER — METHOCARBAMOL 500 MG PO TABS
500.0000 mg | ORAL_TABLET | Freq: Four times a day (QID) | ORAL | Status: DC | PRN
  Administered 2011-09-13 – 2011-09-14 (×5): 500 mg via ORAL
  Filled 2011-09-12 (×6): qty 1

## 2011-09-12 MED ORDER — BUPIVACAINE HCL 0.5 % IJ SOLN
INTRAMUSCULAR | Status: DC | PRN
  Administered 2011-09-12: 20 mL

## 2011-09-12 MED ORDER — KCL IN DEXTROSE-NACL 20-5-0.45 MEQ/L-%-% IV SOLN
INTRAVENOUS | Status: DC
Start: 2011-09-12 — End: 2011-09-14
  Administered 2011-09-12 – 2011-09-13 (×2): via INTRAVENOUS
  Filled 2011-09-12 (×5): qty 1000

## 2011-09-12 MED ORDER — SODIUM CHLORIDE 0.9 % IR SOLN
Status: DC | PRN
  Administered 2011-09-12: 1000 mL

## 2011-09-12 MED ORDER — DIPHENHYDRAMINE HCL 12.5 MG/5ML PO ELIX
12.5000 mg | ORAL_SOLUTION | Freq: Four times a day (QID) | ORAL | Status: DC | PRN
  Filled 2011-09-12: qty 5

## 2011-09-12 MED ORDER — OXYCODONE-ACETAMINOPHEN 5-325 MG PO TABS: 1.0000 | ORAL_TABLET | ORAL | Status: DC | PRN

## 2011-09-12 MED ORDER — DIPHENHYDRAMINE HCL 50 MG/ML IJ SOLN: 12.5000 mg | Freq: Four times a day (QID) | INTRAMUSCULAR | Status: DC | PRN

## 2011-09-12 MED ORDER — GLYCOPYRROLATE 0.2 MG/ML IJ SOLN
INTRAMUSCULAR | Status: DC | PRN
  Administered 2011-09-12: .6 mg via INTRAVENOUS

## 2011-09-12 MED ORDER — CEFAZOLIN SODIUM 1-5 GM-% IV SOLN
1.0000 g | Freq: Four times a day (QID) | INTRAVENOUS | Status: AC
  Administered 2011-09-12 – 2011-09-13 (×3): 1 g via INTRAVENOUS
  Filled 2011-09-12 (×3): qty 50

## 2011-09-12 MED ORDER — LACTATED RINGERS IV SOLN
INTRAVENOUS | Status: DC | PRN
  Administered 2011-09-12 (×4): via INTRAVENOUS

## 2011-09-12 MED ORDER — ONE-DAILY MULTI VITAMINS PO TABS: 1.0000 | ORAL_TABLET | Freq: Every day | ORAL | Status: DC

## 2011-09-12 MED ORDER — THERA M PLUS PO TABS
1.0000 | ORAL_TABLET | Freq: Every day | ORAL | Status: DC
  Filled 2011-09-12 (×3): qty 1

## 2011-09-12 MED ORDER — FENTANYL CITRATE 0.05 MG/ML IJ SOLN: 50.0000 ug | INTRAMUSCULAR | Status: DC | PRN

## 2011-09-12 MED ORDER — CEFAZOLIN SODIUM 1-5 GM-% IV SOLN
INTRAVENOUS | Status: DC | PRN
  Administered 2011-09-12: 1 g via INTRAVENOUS

## 2011-09-12 MED ORDER — SODIUM CHLORIDE 0.9 % IJ SOLN
9.0000 mL | INTRAMUSCULAR | Status: DC | PRN
  Administered 2011-09-12: 3 mL via INTRAVENOUS

## 2011-09-12 MED ORDER — NEOSTIGMINE METHYLSULFATE 1 MG/ML IJ SOLN
INTRAMUSCULAR | Status: DC | PRN
  Administered 2011-09-12: 5 mg via INTRAVENOUS

## 2011-09-12 MED ORDER — MEPERIDINE HCL 25 MG/ML IJ SOLN: 6.2500 mg | INTRAMUSCULAR | Status: DC | PRN

## 2011-09-12 MED ORDER — MIDAZOLAM HCL 2 MG/2ML IJ SOLN: 0.5000 mg | Freq: Once | INTRAMUSCULAR | Status: DC | PRN

## 2011-09-12 MED ORDER — LACTATED RINGERS IV SOLN: INTRAVENOUS | Status: DC

## 2011-09-12 MED ORDER — KETOROLAC TROMETHAMINE 30 MG/ML IJ SOLN
INTRAMUSCULAR | Status: AC
  Administered 2011-09-12: 30 mg via INTRAVENOUS
  Filled 2011-09-12: qty 1

## 2011-09-12 SURGICAL SUPPLY — 68 items
1.6 K-WIRE ×1 IMPLANT
10 H 4.5 locking plate (Orthopedic Implant) ×1 IMPLANT
3.8 DRILL BIT ×1 IMPLANT
4.5 x 26 screw ×1 IMPLANT
APL SKNCLS STERI-STRIP NONHPOA (GAUZE/BANDAGES/DRESSINGS) ×1
BANDAGE ELASTIC 6 VELCRO ST LF (GAUZE/BANDAGES/DRESSINGS) ×1 IMPLANT
BENZOIN TINCTURE PRP APPL 2/3 (GAUZE/BANDAGES/DRESSINGS) ×2 IMPLANT
BIT DRILL 2.5X2.75 QC CALB (BIT) ×1 IMPLANT
BIT DRILL 3.2 QC DISP (BIT) ×1 IMPLANT
BNDG CMPR 9X4 STRL LF SNTH (GAUZE/BANDAGES/DRESSINGS) ×1
BNDG ESMARK 4X9 LF (GAUZE/BANDAGES/DRESSINGS) ×1 IMPLANT
CLOTH BEACON ORANGE TIMEOUT ST (SAFETY) ×2 IMPLANT
CORDS BIPOLAR (ELECTRODE) ×1 IMPLANT
COVER SURGICAL LIGHT HANDLE (MISCELLANEOUS) ×2 IMPLANT
DRAPE C-ARM 42X72 X-RAY (DRAPES) ×2 IMPLANT
DRAPE INCISE IOBAN 66X45 STRL (DRAPES) ×1 IMPLANT
DRAPE PROXIMA HALF (DRAPES) ×1 IMPLANT
DRAPE SURG 17X23 STRL (DRAPES) ×2 IMPLANT
DRAPE U-SHAPE 47X51 STRL (DRAPES) ×2 IMPLANT
DRSG ADAPTIC 3X8 NADH LF (GAUZE/BANDAGES/DRESSINGS) ×1 IMPLANT
DRSG EMULSION OIL 3X3 NADH (GAUZE/BANDAGES/DRESSINGS) ×1 IMPLANT
ELECT CAUTERY BLADE 6.4 (BLADE) ×1 IMPLANT
ELECT REM PT RETURN 9FT ADLT (ELECTROSURGICAL) ×2
ELECTRODE REM PT RTRN 9FT ADLT (ELECTROSURGICAL) ×1 IMPLANT
GAUZE SPONGE 4X4 12PLY STRL LF (GAUZE/BANDAGES/DRESSINGS) ×1 IMPLANT
GLOVE BIOGEL PI IND STRL 6.5 (GLOVE) IMPLANT
GLOVE BIOGEL PI IND STRL 7.5 (GLOVE) ×1 IMPLANT
GLOVE BIOGEL PI IND STRL 8 (GLOVE) ×1 IMPLANT
GLOVE BIOGEL PI INDICATOR 6.5 (GLOVE) ×1
GLOVE BIOGEL PI INDICATOR 7.5 (GLOVE) ×1
GLOVE BIOGEL PI INDICATOR 8 (GLOVE) ×1
GLOVE ECLIPSE 7.0 STRL STRAW (GLOVE) ×2 IMPLANT
GLOVE ORTHO TXT STRL SZ7.5 (GLOVE) ×2 IMPLANT
GLOVE SS BIOGEL STRL SZ 6.5 (GLOVE) IMPLANT
GLOVE SUPERSENSE BIOGEL SZ 6.5 (GLOVE) ×1
GOWN PREVENTION PLUS LG XLONG (DISPOSABLE) IMPLANT
GOWN STRL NON-REIN LRG LVL3 (GOWN DISPOSABLE) ×2 IMPLANT
KIT BASIN OR (CUSTOM PROCEDURE TRAY) ×2 IMPLANT
KIT ROOM TURNOVER OR (KITS) ×2 IMPLANT
MANIFOLD NEPTUNE II (INSTRUMENTS) ×2 IMPLANT
NDL HYPO 25GX1X1/2 BEV (NEEDLE) IMPLANT
NEEDLE HYPO 25GX1X1/2 BEV (NEEDLE) ×2 IMPLANT
NS IRRIG 1000ML POUR BTL (IV SOLUTION) ×2 IMPLANT
PACK SHOULDER (CUSTOM PROCEDURE TRAY) ×2 IMPLANT
PADDING WEBRIL 4 STERILE (GAUZE/BANDAGES/DRESSINGS) ×1 IMPLANT
SCREW CORTICAL 3.5MM  20MM (Screw) ×1 IMPLANT
SCREW CORTICAL 3.5MM 20MM (Screw) IMPLANT
SCREW LOCK CORT HEX 4.5X18 (Screw) ×3 IMPLANT
SCREW LOCK CORT HEX 4.5X20 (Screw) ×1 IMPLANT
SCREW LOCK CORT HEX 4.5X22 (Screw) ×1 IMPLANT
SCREW NLOCK CORT 4.5X22 (Screw) ×3 IMPLANT
SCREW NLOCK CORT 4.5X28 (Screw) ×1 IMPLANT
SCREW NLOCK CORT 4.5X30 (Screw) ×1 IMPLANT
SPONGE GAUZE 4X4 12PLY (GAUZE/BANDAGES/DRESSINGS) ×1 IMPLANT
SPONGE LAP 18X18 X RAY DECT (DISPOSABLE) ×4 IMPLANT
SPONGE LAP 4X18 X RAY DECT (DISPOSABLE) ×2 IMPLANT
STRIP CLOSURE SKIN 1/2X4 (GAUZE/BANDAGES/DRESSINGS) ×2 IMPLANT
SUCTION FRAZIER TIP 10 FR DISP (SUCTIONS) ×2 IMPLANT
SUT FIBERWIRE #2 38 T-5 BLUE (SUTURE)
SUT VIC AB 0 CT1 27 (SUTURE) ×4
SUT VIC AB 0 CT1 27XBRD ANBCTR (SUTURE) IMPLANT
SUT VIC AB 2-0 CT1 27 (SUTURE) ×2
SUT VIC AB 2-0 CT1 TAPERPNT 27 (SUTURE) ×1 IMPLANT
SUT VIC AB 3-0 FS2 27 (SUTURE) ×2 IMPLANT
SUTURE FIBERWR #2 38 T-5 BLUE (SUTURE) IMPLANT
SYR CONTROL 10ML LL (SYRINGE) ×1 IMPLANT
WATER STERILE IRR 1000ML POUR (IV SOLUTION) ×1 IMPLANT
YANKAUER SUCT BULB TIP NO VENT (SUCTIONS) ×1 IMPLANT

## 2011-09-12 NOTE — Discharge Summary (Signed)
Physician Discharge Summary  Patient ID: [name] MRN: [id number] DOB/AGE: 06/30/1976 35 y.o.  Admit date: 09/12/2011 Discharge date: 09/13/2011  Admission Diagnoses:  Principal Problem:  *Fracture, humerus, shaft Active Problems:  Fracture, humerus closed, shaft   Discharge Diagnoses:  Same  Surgeries: Procedure(s): OPEN REDUCTION INTERNAL FIXATION (ORIF) PROXIMAL HUMERUS FRACTURE on 09/12/2011   Consultants: none  Discharged Condition: Improved  Hospital Course: [name] is an 34 y.o. female who was admitted 09/12/2011 and found to have a diagnosis of Fracture, humerus, shaft.  They were brought to the operating room on 09/12/2011 and underwent the above named procedures.    Recent vital signs:  Filed Vitals:   09/12/11 1042  BP:   Pulse: 80  Temp:   Resp: 16    Recent laboratory studies:  Results for orders placed during the hospital encounter of 09/11/11  SURGICAL PCR SCREEN      Component Value Range   MRSA, PCR NEGATIVE  NEGATIVE    Staphylococcus aureus NEGATIVE  NEGATIVE   CBC      Component Value Range   WBC 14.0 (*) 4.0 - 10.5 (K/uL)   RBC 4.17  3.87 - 5.11 (MIL/uL)   Hemoglobin 13.3  12.0 - 15.0 (g/dL)   HCT 40.9  81.1 - 91.4 (%)   MCV 93.8  78.0 - 100.0 (fL)   MCH 31.9  26.0 - 34.0 (pg)   MCHC 34.0  30.0 - 36.0 (g/dL)   RDW 78.2  95.6 - 21.3 (%)   Platelets 251  150 - 400 (K/uL)  COMPREHENSIVE METABOLIC PANEL      Component Value Range   Sodium 139  135 - 145 (mEq/L)   Potassium 4.4  3.5 - 5.1 (mEq/L)   Chloride 104  96 - 112 (mEq/L)   CO2 24  19 - 32 (mEq/L)   Glucose, Bld 85  70 - 99 (mg/dL)   BUN 11  6 - 23 (mg/dL)   Creatinine, Ser 0.86  0.50 - 1.10 (mg/dL)   Calcium 9.7  8.4 - 57.8 (mg/dL)   Total Protein 7.5  6.0 - 8.3 (g/dL)   Albumin 4.3  3.5 - 5.2 (g/dL)   AST 18  0 - 37 (U/L)   ALT 10  0 - 35 (U/L)   Alkaline Phosphatase 47  39 - 117 (U/L)   Total Bilirubin 0.3  0.3 - 1.2 (mg/dL)   GFR calc non Af Amer 75 (*) >90  (mL/min)   GFR calc Af Amer 87 (*) >90 (mL/min)  PROTIME-INR      Component Value Range   Prothrombin Time 14.2  11.6 - 15.2 (seconds)   INR 1.08  0.00 - 1.49   HCG, SERUM, QUALITATIVE      Component Value Range   Preg, Serum NEGATIVE  NEGATIVE     Discharge Medications:   Current Discharge Medication List    START taking these medications   Details  oxyCODONE-acetaminophen (ROXICET) 5-325 MG per tablet Take 1 tablet by mouth every 4 (four) hours as needed for pain. Qty: 60 tablet, Refills: 0      CONTINUE these medications which have NOT CHANGED   Details  Ascorbic Acid (VITAMIN C) 1000 MG tablet Take 1,000 mg by mouth daily.      medroxyPROGESTERone (DEPO-PROVERA) 150 MG/ML injection Inject 150 mg into the muscle every 3 (three) months. Last received injection the end of October 2012     Multiple Vitamin (MULTIVITAMIN) tablet Take 1 tablet by mouth daily.  STOP taking these medications     traMADol (ULTRAM) 50 MG tablet         Diagnostic Studies: Dg Shoulder Left  09/09/2011  *RADIOLOGY REPORT*  Clinical Data: 36 year old female status post fall with pain.  LEFT SHOULDER - 2+ VIEW  Comparison: Left humerus series from the same day.  Findings: Bone mineralization is within normal limits. No glenohumeral joint dislocation.  Left clavicle and scapula appear intact.  Distal left humerus fracture re-identified.  No pneumothorax.  Irregularity of the lateral left fifth and sixth ribs on the AP view does not persist on the scapular Y view.  IMPRESSION: No acute fracture or dislocation identified at the left shoulder. Distal left humerus fracture re-identified.  Original Report Authenticated By: Harley Hallmark, M.D.   Dg Humerus Left  09/12/2011  *RADIOLOGY REPORT*  Clinical Data: Left humerus fracture.  LEFT HUMERUS - 2+ VIEW  Comparison: Radiographs dated 09/09/2011  Findings: Multiple c-arm images demonstrate the patient has undergone open reduction and internal fixation  of the comminuted spiral fracture of the humeral shaft.  There is no overriding of the fracture fragments.  Alignment has improved.  No angulation.  IMPRESSION: Open reduction and internal fixation of mid left humerus fracture.  Original Report Authenticated By: Gwynn Burly, M.D.   Dg Humerus Left  09/09/2011  *RADIOLOGY REPORT*  Clinical Data: 35 year old female status post fall with pain.  LEFT HUMERUS - 2+ VIEW  Comparison: None.  Findings: Comminuted distal third left humerus shaft fracture with medially displaced butterfly fragment.  Otherwise near anatomic alignment.  Spiral type fracture lucency extends as far as the distal shaft, proximal aspect of the distal metadiaphysis.  Visualized ribs appear grossly intact.  Negative visualized lung parenchyma.  Alignment at the left shoulder appears intact.  IMPRESSION: Comminuted, spiral distal left humerus shaft fracture with medially displaced butterfly fragment.  Original Report Authenticated By: Harley Hallmark, M.D.    Disposition: Home or Self Care    Follow-up Information    Follow up with YATES,MARK C. Make an appointment in 1 week.   Contact information:   [facility] [address] [place] [address] [phone]           Signed: Wende Neighbors 09/12/2011, 2:53 PM

## 2011-09-12 NOTE — Anesthesia Postprocedure Evaluation (Signed)
  Anesthesia Post-op Note  Patient: [name]  Procedure(s) Performed:  OPEN REDUCTION INTERNAL FIXATION (ORIF) PROXIMAL HUMERUS FRACTURE - Open Reduction Internal Fixation Left Humerus  Patient Location: PACU  Anesthesia Type: General  Level of Consciousness: awake  Airway and Oxygen Therapy: Patient Spontanous Breathing  Post-op Pain: mild  Post-op Assessment: Post-op Vital signs reviewed  Post-op Vital Signs: stable  Complications: No apparent anesthesia complications

## 2011-09-12 NOTE — Op Note (Signed)
[name], [name] NO.:  [id number]  MEDICAL RECORD NO.:  [id number]  LOCATION:  5010                         FACILITY:  [facility]  PHYSICIAN:  Terrika Zuver C. Ophelia Charter, M.D.    DATE OF BIRTH:  Oct 24, 1976  DATE OF PROCEDURE:  09/12/2011 DATE OF DISCHARGE:                              OPERATIVE REPORT   PREOPERATIVE DIAGNOSIS:  Left comminuted humeral shaft fracture.  POST OPERATIVE DIAGNOSIS:  Left comminuted humeral shaft fracture.  PROCEDURE:  Open reduction and internal fixation left humeral shaft fracture, anterior approach.  SURGEON:  Nyra Anspaugh C. Ophelia Charter, M.D.  ASSISTANT:  Wende Neighbors, PA-C, medically necessary and present for the entire procedure.  ANESTHESIA:  GOT plus Marcaine, local.  ESTIMATED BLOOD LOSS:  100 mL.  TOURNIQUET TIME:  45 minutes.  BRIEF HISTORY:  This patient fell at her house and suffered a comminuted left humeral shaft fracture that extended from the distal-third to the proximal-third with multiple butterfly fragments.  Radial nerve was intact.  Neurovascular exam was normal.  PROCEDURE IN DETAIL:  After standard prepping and draping, Ancef prophylaxis with draping up to the shoulder level for potential exposure all the way to the deltopectoral groove, prepping and draping was performed with DuraPrep.  The area was draped with impervious stockinette, Coban, split sheets and drapes, sterile skin marker, Betadine, Steri-Drape sealing the skin.  Time-out procedure was completed.  Incision was made anteriorly after proximal arm tourniquet was inflated.  Arm was wrapped in Esmarch and tourniquet was inflated, this was a sterile tourniquet.  Distal half of the incision was made, there was significant subcutaneous hematoma from multiple comminuted fracture.  Biceps was identified and the interval between the biceps and the brachialis was developed.  Brachialis was divided by splitting the brachialis down the middle slightly more towards the  lateral aspect. Multiple comminuted fragments were immediately identified with multiple butterfly fragments.  The bolster was rolled up with 4 towels using Coban and used to place underneath the wrist keep the arm in flexion. The patient was paralyzed under anesthesia to help with the reduction of the multiple fragments.  In the next 45 minutes, multiple fragments were mobilized, care was taken for positioning of the radial nerve and no pinching retractors were placed posteriorly from the mid shaft of the radius and measurements were made from the lateral epicondyle for the appropriate position where the left radial nerve was positioned.  Radial nerve was not visualized during the case.  Fragments were reduced and held with K-wires, not poking through the opposite cortex, just engaging the opposite cortex and I have held multiple fragments together. Interfrag screws were placed for the distal most fragment and the largest distal butterfly fragment holding together.  There was a small fragment that was a butterfly fragment that was 5 cm long and about 4-5 mm wide, that was posteromedially that was able to be reduced in anatomic position caught with a lag screw from distal lateral coming proximal medial for more anterior to more posterior.  It was thought this fragment then aided with further reduction.  Pieces were obtained. We put together.  Distal pieces fitted well the proximal-most portion, fragment did not mobilize well.  C-arm was draped and brought in several times for checking positioning as multiple fragments were aligned. There was another butterfly fragment that was reduced, which was medial above the smaller 5 cm long piece.  This was locked in with additional lag screw.  A 11 holed Biomet plate previously known as DePuy was placed.  This was a locking plate fashion held on with planes checked under fluoroscopy.  There was a slight override proximally; however, on the AP plane,  it was anatomic.  Continued work was made; however, the proximal piece still had some override and best reduction was able to be obtained.  Compression screws were then placed, nonlocking, in the slotted hole holding the plate and then the remaining holes that were locking holes, had a locking guide applied.  These varied in length from 18-22 mm.  There was tight rasp with the opposite cortex and care was taken and checked under fluoroscopy to make sure as the posterior cortex was drilled.  There was no plunging to prevent injury to the radial nerve.  Numerous times, during the procedure, irrigation was used.  As the dissection continued more proximal, the sterile tourniquet was removed.  Incision was extended proximally up to the inferior aspect of the bicipital groove and the pectoralis insertion site was visualized. Locking screws were finished and all screw holes were filled.  Final spot pictures were taken fluoroscopically documenting positioning. After final pictures were taken, repeat irrigation and then reapproximation of the anterior biceps, fascia, subcutaneous tissue were reapproximated with 2-0 Vicryl and subcuticular Vicryl skin closure. Marcaine infiltration in the skin.  Steri-Strips, postop dressing, long posterior splint, and the patient was placed back in her right shoulder immobilizer.  The patient tolerated the procedure well, not woken up in the recovery room for radial nerve exam at this time, but we will check it shortly.  Instruments count and needle count was correct.  Procedure was as posted.     Gibson Lad C. Ophelia Charter, M.D.     MCY/MEDQ  D:  09/12/2011  T:  09/12/2011  Job:  [id number]

## 2011-09-12 NOTE — Transfer of Care (Signed)
Immediate Anesthesia Transfer of Care Note  Patient: [name]  Procedure(s) Performed:  OPEN REDUCTION INTERNAL FIXATION (ORIF) PROXIMAL HUMERUS FRACTURE - Open Reduction Internal Fixation Left Humerus  Patient Location: PACU  Anesthesia Type: General  Level of Consciousness: awake, alert  and oriented  Airway & Oxygen Therapy: Patient Spontanous Breathing and Patient connected to nasal cannula oxygen  Post-op Assessment: Report given to PACU RN  Post vital signs: Reviewed and stable  Complications: No apparent anesthesia complications

## 2011-09-12 NOTE — Brief Op Note (Signed)
09/12/2011  1:22 PM  PATIENT:  [name]  35 y.o. female  PRE-OPERATIVE DIAGNOSIS:  Left Humeral Shaft Fracture  POST-OPERATIVE DIAGNOSIS:  Left humeral shaft fracture  PROCEDURE:  Procedure(s): OPEN REDUCTION INTERNAL FIXATION (ORIF) PROXIMAL HUMERUS FRACTURE  biomet depuy 4.5 locking plate  SURGEON:  Surgeon(s): Eldred Manges  PHYSICIAN ASSISTANT:   ASSISTANTS: Maud Deed PA-C   ANESTHESIA:   local and general  EBL:  Total I/O In: 1000 [I.V.:1000] Out: -   BLOOD ADMINISTERED:none  DRAINS: none   LOCAL MEDICATIONS USED:  MARCAINE 15CC  SPECIMEN:  No Specimen  DISPOSITION OF SPECIMEN:  N/A  COUNTS:  YES  TOURNIQUET:   Total Tourniquet Time Documented: Upper Arm (Left) - -17 minutes  DICTATION: .Other Dictation: Dictation Number [id number]  PLAN OF CARE: Admit for overnight observation  PATIENT DISPOSITION:  PACU - hemodynamically stable.   Delay start of Pharmacological VTE agent (>24hrs) due to surgical blood loss or risk of bleeding:  {YES/NO/NOT APPLICABLE:20182

## 2011-09-12 NOTE — Interval H&P Note (Signed)
History and Physical Interval Note:   09/12/2011   10:01 AM   [name]  has presented today for surgery, with the diagnosis of Left Humeral Shaft Fracture  The various methods of treatment have been discussed with the patient and family. After consideration of risks, benefits and other options for treatment, the patient has consented to  Procedure(s): OPEN REDUCTION INTERNAL FIXATION (ORIF) PROXIMAL HUMERUS FRACTURE as a surgical intervention .  The patients' history has been reviewed, patient examined, no change in status, stable for surgery.  I have reviewed the patients' chart and labs.  Questions were answered to the patient's satisfaction.     Eldred Manges  MD The patient has been re-examined, and the chart reviewed, and there have been no interval changes to the documented history and physical.    The risks, benefits, and alternatives have been discussed at length, and the patient is willing to proceed.

## 2011-09-12 NOTE — Preoperative (Signed)
Beta Blockers   Reason not to administer Beta Blockers:Not Applicable 

## 2011-09-12 NOTE — Anesthesia Preprocedure Evaluation (Addendum)
Anesthesia Evaluation  Patient identified by MRN, date of birth, ID band Patient awake    Airway Mallampati: II      Dental   Pulmonary          Cardiovascular     Neuro/Psych    GI/Hepatic   Endo/Other    Renal/GU      Musculoskeletal   Abdominal   Peds  Hematology   Anesthesia Other Findings   Reproductive/Obstetrics                           Anesthesia Physical Anesthesia Plan  ASA: II  Anesthesia Plan: General   Post-op Pain Management:    Induction: Intravenous  Airway Management Planned:   Additional Equipment:   Intra-op Plan:   Post-operative Plan:   Informed Consent: I have reviewed the patients History and Physical, chart, labs and discussed the procedure including the risks, benefits and alternatives for the proposed anesthesia with the patient or authorized representative who has indicated his/her understanding and acceptance.   Dental advisory given  Plan Discussed with: CRNA  Anesthesia Plan Comments:         Anesthesia Quick Evaluation

## 2011-09-12 NOTE — Progress Notes (Signed)
Post op exam has radial sensory with some tingling.  EPL flicker and no EDC on index thru small finger. Radial nerve not between any fracture fragments, each one seen directly at time of fixation.  Multiple fragment butterfly pieces. All screws look good on post op images.    Assessment  S/P ORIF Humeral shaft fracture.  Radial nerve motor weakness should improve with time. Radial sensory present.   Plan-  Home Sat if pain controlled.

## 2011-09-13 MED ORDER — OXYCODONE-ACETAMINOPHEN 5-325 MG PO TABS: 1.0000 | ORAL_TABLET | ORAL | Status: DC | PRN

## 2011-09-13 NOTE — Progress Notes (Signed)
PT ASSESSMENT COMPLETE,PT USING PCA PRN AND MEDS EFFECTIVE IN RELIEVING LEFT ARM PAIN. PT PROVIDED COMFORT MEASURES,TOLERATING PO DIET AND FLUIDS. PT LEFT ARM DRESSING DRY AND INTACT AND SLING INTACT. PT ACTIVELING MOVING LEFT FINGERS PRN. WILL CONTINUE TO MONITOR.

## 2011-09-13 NOTE — Progress Notes (Signed)
Subjective: 1 Day Post-Op Procedure(s) (LRB): OPEN REDUCTION INTERNAL FIXATION (ORIF) PROXIMAL HUMERUS FRACTURE (Left) Patient complains of numbness in the radial nerve distribution of the left hand. She also states that she cannot extend her fingers or wrist.    Objective: Vital signs in last 24 hours: Temp:  [97.5 F (36.4 C)-99.9 F (37.7 C)] 98.2 F (36.8 C) (11/17 0602) Pulse Rate:  [80-112] 90  (11/17 0602) Resp:  [9-20] 20  (11/17 0846) BP: (107-128)/(56-75) 107/68 mmHg (11/17 0602) SpO2:  [96 %-100 %] 100 % (11/17 0846) FiO2 (%):  [2 %] 2 % (11/17 0602)  Intake/Output from previous day: 11/16 0701 - 11/17 0700 In: 1800 [I.V.:1800] Out: 225 [Blood:225] Intake/Output this shift:     Basename 09/11/11 1420  HGB 13.3    Basename 09/11/11 1420  WBC 14.0*  RBC 4.17  HCT 39.1  PLT 251    Basename 09/11/11 1420  NA 139  K 4.4  CL 104  CO2 24  BUN 11  CREATININE 0.96  GLUCOSE 85  CALCIUM 9.7    Basename 09/11/11 1420  LABPT --  INR 1.08    On examination patient has numbness in the radial nerve distribution and lax motor function of the radial nerve.  Assessment/Plan: 1 Day Post-Op Procedure(s) (LRB): OPEN REDUCTION INTERNAL FIXATION (ORIF) PROXIMAL HUMERUS FRACTURE (Left) Patient will be tried on oral pain medication today if she is comfortable on oral pain medication she'll be discharged today for followup with Dr. Ophelia Charter next week.  [name] V 09/13/2011, 8:48 AM

## 2011-09-14 MED ORDER — METHOCARBAMOL 500 MG PO TABS: 500.0000 mg | ORAL_TABLET | Freq: Four times a day (QID) | ORAL | Status: AC | PRN

## 2011-09-14 MED ORDER — OXYCODONE-ACETAMINOPHEN 5-325 MG PO TABS: 1.0000 | ORAL_TABLET | ORAL | Status: AC | PRN

## 2011-09-14 MED ORDER — METHOCARBAMOL 500 MG PO TABS: 500.0000 mg | ORAL_TABLET | Freq: Four times a day (QID) | ORAL | Status: DC | PRN

## 2011-09-14 NOTE — Progress Notes (Signed)
Subjective: 2 Days Post-Op Procedure(s) (LRB): OPEN REDUCTION INTERNAL FIXATION (ORIF) PROXIMAL HUMERUS FRACTURE (Left) Patient reports persistent motor and sensory deficit to the radial nerve.   Objective: Vital signs in last 24 hours: Temp:  [97.6 F (36.4 C)-99.3 F (37.4 C)] 99.3 F (37.4 C) (11/18 0657) Pulse Rate:  [87-96] 92  (11/18 0657) Resp:  [16-20] 18  (11/18 0657) BP: (113-129)/(60-70) 129/64 mmHg (11/18 0657) SpO2:  [95 %-100 %] 99 % (11/18 0657) FiO2 (%):  [2 %] 2 % (11/17 1456)  Intake/Output from previous day: 11/17 0701 - 11/18 0700 In: 1470 [P.O.:1260; I.V.:210] Out: -  Intake/Output this shift:     Basename 09/11/11 1420  HGB 13.3    Basename 09/11/11 1420  WBC 14.0*  RBC 4.17  HCT 39.1  PLT 251    Basename 09/11/11 1420  NA 139  K 4.4  CL 104  CO2 24  BUN 11  CREATININE 0.96  GLUCOSE 85  CALCIUM 9.7    Basename 09/11/11 1420  LABPT --  INR 1.08    No cellulitis present  Assessment/Plan: 2 Days Post-Op Procedure(s) (LRB): OPEN REDUCTION INTERNAL FIXATION (ORIF) PROXIMAL HUMERUS FRACTURE (Left) Discharge home with home health  DUDA,MARCUS V 09/14/2011, 9:49 AM

## 2011-10-07 ENCOUNTER — Encounter (HOSPITAL_COMMUNITY): Payer: Self-pay | Admitting: Orthopaedic Surgery

## 2011-10-30 ENCOUNTER — Other Ambulatory Visit: Payer: Self-pay | Admitting: Obstetrics and Gynecology

## 2011-10-30 DIAGNOSIS — N6310 Unspecified lump in the right breast, unspecified quadrant: Secondary | ICD-10-CM

## 2011-10-31 ENCOUNTER — Other Ambulatory Visit: Payer: Self-pay | Admitting: Obstetrics and Gynecology

## 2011-10-31 ENCOUNTER — Ambulatory Visit
Admission: RE | Admit: 2011-10-31 | Discharge: 2011-10-31 | Disposition: A | Discharge status: Home / Self Care | Payer: Commercial Managed Care - PPO | Source: Ambulatory Visit | Attending: Obstetrics and Gynecology | Admitting: Obstetrics and Gynecology

## 2011-10-31 DIAGNOSIS — N6310 Unspecified lump in the right breast, unspecified quadrant: Secondary | ICD-10-CM

## 2011-10-31 DIAGNOSIS — N6009 Solitary cyst of unspecified breast: Secondary | ICD-10-CM

## 2011-11-11 ENCOUNTER — Ambulatory Visit
Admission: RE | Admit: 2011-11-11 | Discharge: 2011-11-11 | Disposition: A | Discharge status: Home / Self Care | Payer: Commercial Managed Care - PPO | Source: Ambulatory Visit | Attending: Obstetrics and Gynecology | Admitting: Obstetrics and Gynecology

## 2011-11-11 DIAGNOSIS — N6009 Solitary cyst of unspecified breast: Secondary | ICD-10-CM

## 2012-01-12 ENCOUNTER — Encounter: Payer: Self-pay | Admitting: Obstetrics and Gynecology

## 2012-02-26 ENCOUNTER — Telehealth: Payer: Self-pay

## 2012-02-26 ENCOUNTER — Telehealth: Payer: Self-pay | Admitting: Obstetrics and Gynecology

## 2012-02-26 MED ORDER — TRAMADOL HCL 50 MG PO TABS
50.0000 mg | ORAL_TABLET | Freq: Four times a day (QID) | ORAL | Status: DC | PRN
Start: 2012-02-26 — End: 2014-03-26

## 2012-02-26 NOTE — Telephone Encounter (Signed)
Lm on vm tcb rgd msg 

## 2012-02-26 NOTE — Telephone Encounter (Signed)
Spoke with pt rgd msg pt wants rf on tramadol states having severe cramping and pain with cycle wants rx called to [facility] advised pt will consult with AVS and call her back pt voice understanding

## 2012-02-26 NOTE — Telephone Encounter (Signed)
Routed to triage 

## 2012-02-26 NOTE — Telephone Encounter (Signed)
Lm on vm rx sent to pharm per AVS pt can have rx for ultram disp #30 0 rf

## 2012-02-27 ENCOUNTER — Telehealth: Payer: Self-pay

## 2012-02-27 ENCOUNTER — Other Ambulatory Visit: Payer: Self-pay

## 2012-02-27 NOTE — Telephone Encounter (Signed)
Spoke with pt rgd msg had question to where rx was sent advised pt rx sent to pharmacy  On file [facility] on [address] pt wants rx called to [facility] advised pt will cancel rx at [facility] and [facility] pt voice understanding . rx canceled at [facility]  [facility]

## 2012-03-11 ENCOUNTER — Other Ambulatory Visit: Payer: Self-pay | Admitting: Obstetrics and Gynecology

## 2012-03-11 NOTE — Telephone Encounter (Signed)
Triage/pt has not been seen since 11/2011

## 2012-03-12 ENCOUNTER — Telehealth: Payer: Self-pay | Admitting: Obstetrics and Gynecology

## 2012-03-12 MED ORDER — TRAMADOL HCL 50 MG PO TABS: 50.0000 mg | ORAL_TABLET | Freq: Four times a day (QID) | ORAL | Status: DC | PRN

## 2012-03-12 NOTE — Telephone Encounter (Signed)
Telephone call to patient. Dr. Stefano Gaul

## 2012-03-12 NOTE — Telephone Encounter (Signed)
Addended by: Janine Limbo on: 03/12/2012 04:43 PM   Modules accepted: Orders

## 2012-03-23 ENCOUNTER — Other Ambulatory Visit: Payer: Self-pay | Admitting: Obstetrics and Gynecology

## 2012-03-25 ENCOUNTER — Telehealth: Payer: Self-pay | Admitting: Obstetrics and Gynecology

## 2012-03-25 NOTE — Telephone Encounter (Signed)
Tc from pt.   Requesting RF today for Tramadol as is losing insurance 03/26/12.  Informed message has been sent to Dr AVS.  Karis Juba aware.

## 2012-03-25 NOTE — Telephone Encounter (Signed)
TC to pt. States has used almost #80 Tramadol since 03/12/12.  Is taking due to headaches. Also having cramping w/menses..  Has noticed sx ever since Mirena inserted in 11/2011.  Consult with Dr AVS.   RF for Tramadol denied.  Advised either schedule appt here for F/U or be seen at [facility].   Pt states will go to [facility] today prior to insurance ending.

## 2012-03-25 NOTE — Telephone Encounter (Signed)
DR. AVS, PLEASE SEE PT'S NOTE, REQUESTING  A RF OF TRAMADOL

## 2012-06-29 ENCOUNTER — Telehealth: Payer: Self-pay | Admitting: Obstetrics and Gynecology

## 2012-06-29 NOTE — Telephone Encounter (Signed)
Triage/epic 

## 2012-07-01 NOTE — Telephone Encounter (Signed)
Tc to pt, states had IUD inserted in January, however before insertion of IUD, had regular cycles without any severe pain/cramping.  Now is having severe cramping every month when cycles comes on, pain lasts about 3 days.  Pt says she is able to feel strings of IUD, unsure of what to do.  Consulted w/ EP, informed pt per EP suggestion, thinks pt needs an [place] to determine placement of IUD.  Pt voices understanding.   Gave pt quotes of how much everything would cost, pt states is ok at this time bc she is not on her cycle, but will discuss w/ husband and will call back with an appt for around that time of her cycle.

## 2012-11-02 IMAGING — CR DG SHOULDER 2+V*L*
2 series · 2 of 2 positions shown · non-contrast
Comparison: Left humerus series from the same day.

CLINICAL DATA: 35-year-old female status post fall with pain.

LEFT SHOULDER - 2+ VIEW

[w shoulder y-view left]
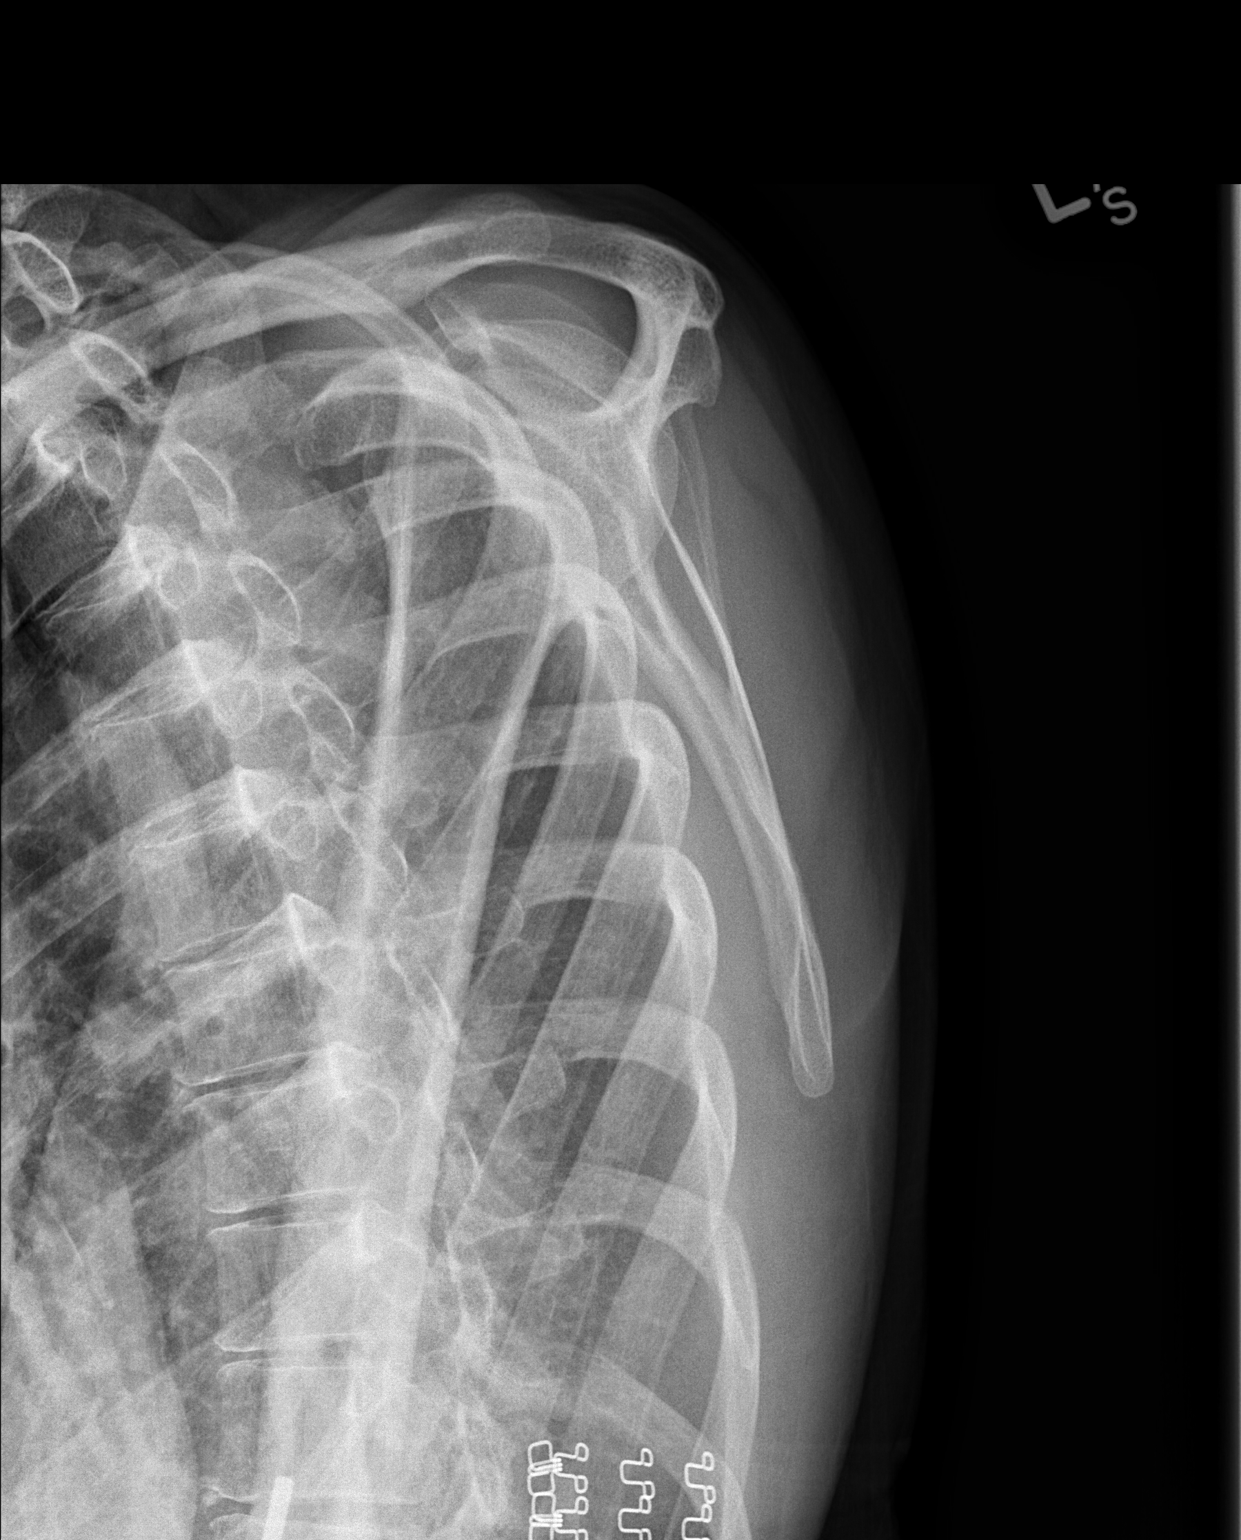

[w shoulder internal left]
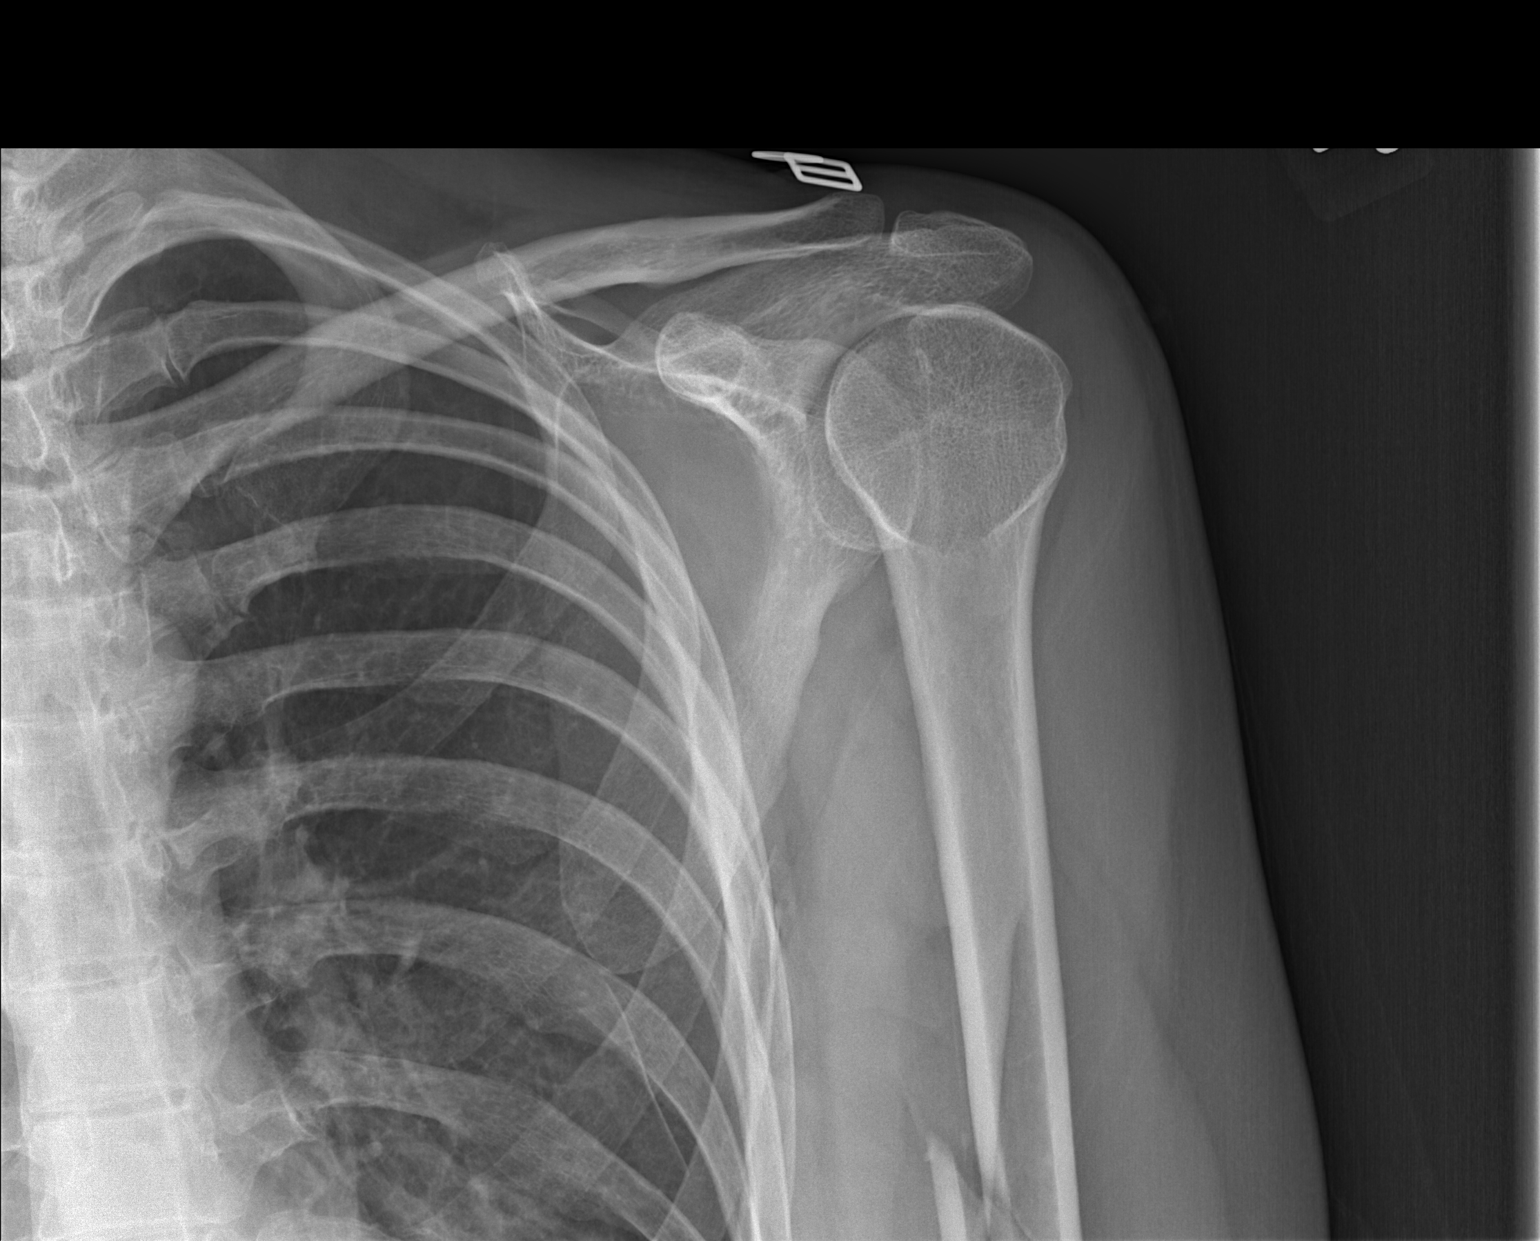

[2 of 2 positions shown; findings below may reference images not displayed]

FINDINGS: Bone mineralization is within normal limits. No
glenohumeral joint dislocation.  Left clavicle and scapula appear
intact.  Distal left humerus fracture re-identified.  No
pneumothorax.  Irregularity of the lateral left fifth and sixth
ribs on the AP view does not persist on the scapular Y view.
IMPRESSION: No acute fracture or dislocation identified at the left shoulder.
Distal left humerus fracture re-identified.

## 2014-03-26 ENCOUNTER — Emergency Department (HOSPITAL_COMMUNITY): Payer: Commercial Managed Care - PPO

## 2014-03-26 ENCOUNTER — Emergency Department (HOSPITAL_COMMUNITY)
Admission: EM | Admit: 2014-03-26 | Discharge: 2014-03-26 | Disposition: A | Discharge status: Home / Self Care | Payer: Commercial Managed Care - PPO | Attending: Emergency Medicine | Admitting: Emergency Medicine

## 2014-03-26 ENCOUNTER — Encounter (HOSPITAL_COMMUNITY): Payer: Self-pay | Admitting: Emergency Medicine

## 2014-03-26 DIAGNOSIS — Y9239 Other specified sports and athletic area as the place of occurrence of the external cause: Secondary | ICD-10-CM | POA: Insufficient documentation

## 2014-03-26 DIAGNOSIS — Z87891 Personal history of nicotine dependence: Secondary | ICD-10-CM | POA: Insufficient documentation

## 2014-03-26 DIAGNOSIS — Y9366 Activity, soccer: Secondary | ICD-10-CM | POA: Insufficient documentation

## 2014-03-26 DIAGNOSIS — Z8659 Personal history of other mental and behavioral disorders: Secondary | ICD-10-CM | POA: Insufficient documentation

## 2014-03-26 DIAGNOSIS — W219XXA Striking against or struck by unspecified sports equipment, initial encounter: Secondary | ICD-10-CM | POA: Insufficient documentation

## 2014-03-26 DIAGNOSIS — Y92838 Other recreation area as the place of occurrence of the external cause: Secondary | ICD-10-CM

## 2014-03-26 DIAGNOSIS — S90129A Contusion of unspecified lesser toe(s) without damage to nail, initial encounter: Secondary | ICD-10-CM | POA: Insufficient documentation

## 2014-03-26 DIAGNOSIS — S90111A Contusion of right great toe without damage to nail, initial encounter: Secondary | ICD-10-CM

## 2014-03-26 MED ORDER — HYDROCODONE-ACETAMINOPHEN 5-325 MG PO TABS
2.0000 | ORAL_TABLET | Freq: Once | ORAL | Status: AC
  Administered 2014-03-26: 2 via ORAL
  Filled 2014-03-26: qty 2

## 2014-03-26 MED ORDER — HYDROCODONE-ACETAMINOPHEN 5-325 MG PO TABS
ORAL_TABLET | ORAL | Status: DC
Start: 2014-03-26 — End: 2023-09-29

## 2014-03-26 NOTE — ED Provider Notes (Signed)
CSN: [id number]     Arrival date & time 03/26/14  0020 History   First MD Initiated Contact with Patient 03/26/14 0105     Chief Complaint  Patient presents with  . Foot Pain     (Consider location/radiation/quality/duration/timing/severity/associated sxs/prior Treatment) HPI  [name] is a 38 y.o. female complaining of pain to right great toe after she kicked a soccer ball with a bare foot. He has been increasing for several hours, 6/10, exacerbated with weightbearing. No pain medication prior to arrival.   Past Medical History  Diagnosis Date  . Anxiety    Past Surgical History  Procedure Laterality Date  . Tonsillectomy    . Lithotripsy    . Orif humerus fracture  09/12/2011    Procedure: OPEN REDUCTION INTERNAL FIXATION (ORIF) PROXIMAL HUMERUS FRACTURE;  Surgeon: Eldred MangesMark C Yates;  Location: [facility] OR;  Service: Orthopedics;  Laterality: Left;  Open Reduction Internal Fixation Left Humerus   Family History  Problem Relation Age of Onset  . Rheum arthritis Mother   . Diabetes Mother   . Heart failure Mother    History  Substance Use Topics  . Smoking status: Former Smoker    Quit date: 11/15/2010  . Smokeless tobacco: Never Used  . Alcohol Use: 0.6 oz/week    1 Glasses of wine per week   OB History   Grav Para Term Preterm Abortions TAB SAB Ect Mult Living                 Review of Systems  10 systems reviewed and found to be negative, except as noted in the HPI.   Allergies  Aspirin  Home Medications   Prior to Admission medications   Medication Sig Start Date End Date Taking? Authorizing Provider  acetaminophen (TYLENOL) 325 MG tablet Take 650 mg by mouth every 6 (six) hours as needed for moderate pain.   Yes Historical Provider, MD  levonorgestrel (MIRENA) 20 MCG/24HR IUD 1 each by Intrauterine route once. 11/01/09  Yes Historical Provider, MD  HYDROcodone-acetaminophen (NORCO/VICODIN) 5-325 MG per tablet Take 1-2 tablets by mouth every 6 hours as needed  for pain. 03/26/14   Lamoine Magallon, PA-C   BP 143/79  Pulse 76  Temp(Src) 98.8 F (37.1 C) (Oral)  Resp 16  SpO2 100% Physical Exam  Nursing note and vitals reviewed. Constitutional: She is oriented to person, place, and time. She appears well-developed and well-nourished. No distress.  HENT:  Head: Normocephalic.  Eyes: Conjunctivae and EOM are normal.  Cardiovascular: Normal rate.   Pulmonary/Chest: Effort normal. No stridor.  Musculoskeletal: Normal range of motion.       Feet:  Neurological: She is alert and oriented to person, place, and time.  Psychiatric: She has a normal mood and affect.    ED Course  Procedures (including critical care time) Labs Review Labs Reviewed - No data to display  Imaging Review Dg Toe Great Right  03/26/2014   CLINICAL DATA:  Right foot pain, greatest at right great title.  EXAM: RIGHT GREAT TOE  COMPARISON:  Prior radiograph from 03/21/2006.  FINDINGS: There is no evidence of fracture or dislocation. No significant soft tissue swelling identified. Joint spaces are maintained. No radiopaque foreign body.  IMPRESSION: No acute fracture or dislocation.   Electronically Signed   By: Rise MuBenjamin  McClintock M.D.   On: 03/26/2014 01:45     EKG Interpretation None      MDM   Final diagnoses:  Contusion of great toe, right  Filed Vitals:   03/26/14 0055  BP: 143/79  Pulse: 76  Temp: 98.8 F (37.1 C)  TempSrc: Oral  Resp: 16  SpO2: 100%    Medications  HYDROcodone-acetaminophen (NORCO/VICODIN) 5-325 MG per tablet 2 tablet (not administered)    [name] is a 38 y.o. female presenting with right great toe pain after jamming it earlier in the day. X-rays negative. Patient will be given crutches, recommended rest, ice, compression and elevation.  Evaluation does not show pathology that would require ongoing emergent intervention or inpatient treatment. Pt is hemodynamically stable and mentating appropriately. Discussed  findings and plan with patient/guardian, who agrees with care plan. All questions answered. Return precautions discussed and outpatient follow up given.   New Prescriptions   HYDROCODONE-ACETAMINOPHEN (NORCO/VICODIN) 5-325 MG PER TABLET    Take 1-2 tablets by mouth every 6 hours as needed for pain.    Note: Portions of this report may have been transcribed using voice recognition software. Every effort was made to ensure accuracy; however, inadvertent computerized transcription errors may be present    Wynetta Emery, PA-C 03/26/14 0208

## 2014-03-26 NOTE — ED Notes (Signed)
Pt arrived with a complaint of right big toe pain.  Pt kick a soccer ball without her shoes on.  Initially the toe hurt but pt continue to walk on it.  Pt then experienced increasing pain and swelling.

## 2014-03-26 NOTE — Discharge Instructions (Signed)
For pain control please take ibuprofen (also known as Motrin or Advil) 800mg  (this is normally 4 over the counter pills) 3 times a day  for 5 days. Take with food to minimize stomach irritation.  Take vicodin for breakthrough pain, do not drink alcohol, drive, care for children or do other critical tasks while taking vicodin.  Please follow with your primary care doctor in the next 2 days for a check-up. They must obtain records for further management.   Do not hesitate to return to the Emergency Department for any new, worsening or concerning symptoms.    Contusion A contusion is a deep bruise. Contusions are the result of an injury that caused bleeding under the skin. The contusion may turn blue, purple, or yellow. Minor injuries will give you a painless contusion, but more severe contusions may stay painful and swollen for a few weeks.  CAUSES  A contusion is usually caused by a blow, trauma, or direct force to an area of the body. SYMPTOMS   Swelling and redness of the injured area.  Bruising of the injured area.  Tenderness and soreness of the injured area.  Pain. DIAGNOSIS  The diagnosis can be made by taking a history and physical exam. An X-ray, CT scan, or MRI may be needed to determine if there were any associated injuries, such as fractures. TREATMENT  Specific treatment will depend on what area of the body was injured. In general, the best treatment for a contusion is resting, icing, elevating, and applying cold compresses to the injured area. Over-the-counter medicines may also be recommended for pain control. Ask your caregiver what the best treatment is for your contusion. HOME CARE INSTRUCTIONS   Put ice on the injured area.  Put ice in a plastic bag.  Place a towel between your skin and the bag.  Leave the ice on for 15-20 minutes, 03-04 times a day.  Only take over-the-counter or prescription medicines for pain, discomfort, or fever as directed by your caregiver.  Your caregiver may recommend avoiding anti-inflammatory medicines (aspirin, ibuprofen, and naproxen) for 48 hours because these medicines may increase bruising.  Rest the injured area.  If possible, elevate the injured area to reduce swelling. SEEK IMMEDIATE MEDICAL CARE IF:   You have increased bruising or swelling.  You have pain that is getting worse.  Your swelling or pain is not relieved with medicines. MAKE SURE YOU:   Understand these instructions.  Will watch your condition.  Will get help right away if you are not doing well or get worse. Document Released: 07/23/2005 Document Revised: 01/05/2012 Document Reviewed: 08/18/2011 [facility] Patient Information 2014 [place], [place].

## 2014-03-26 NOTE — ED Provider Notes (Signed)
Medical screening examination/treatment/procedure(s) were performed by non-physician practitioner and as supervising physician I was immediately available for consultation/collaboration.   EKG Interpretation None       Billie Trager M Deepak Bless, MD 03/26/14 0323 

## 2015-05-20 IMAGING — CR DG TOE GREAT 2+V*R*
4 series · 4 of 4 positions shown · non-contrast
Comparison: Prior radiograph from 03/21/2006.

CLINICAL DATA: Right foot pain, greatest at right great title.

EXAM:
RIGHT GREAT TOE

[x toes ap right]
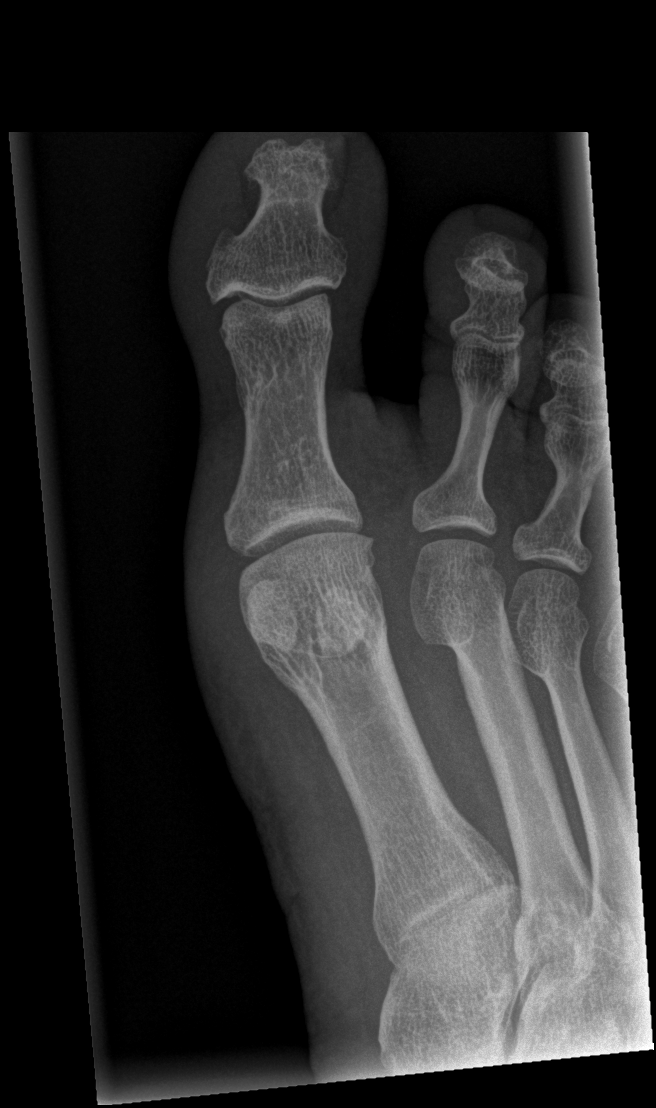

[x toes obl right]
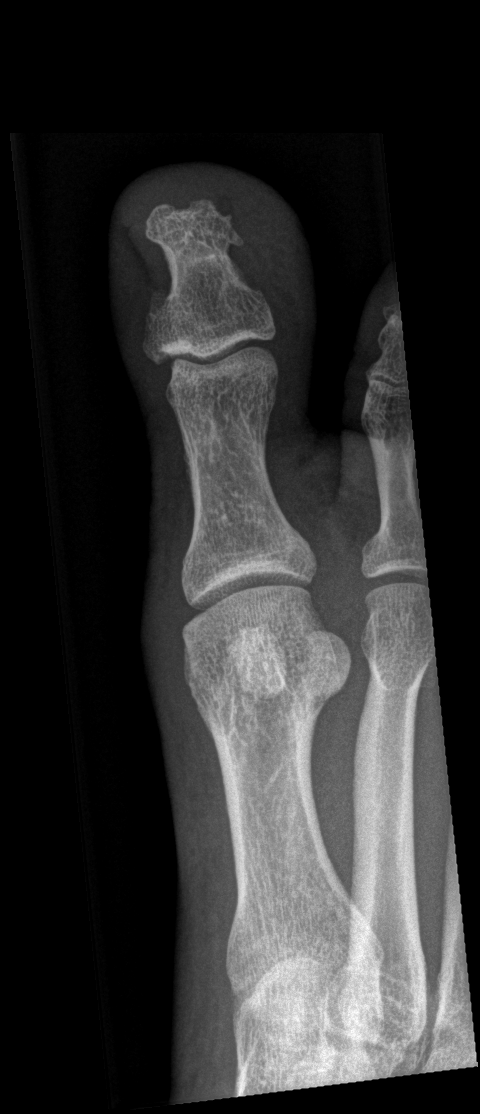

[x toes lat right (1 of 2)]
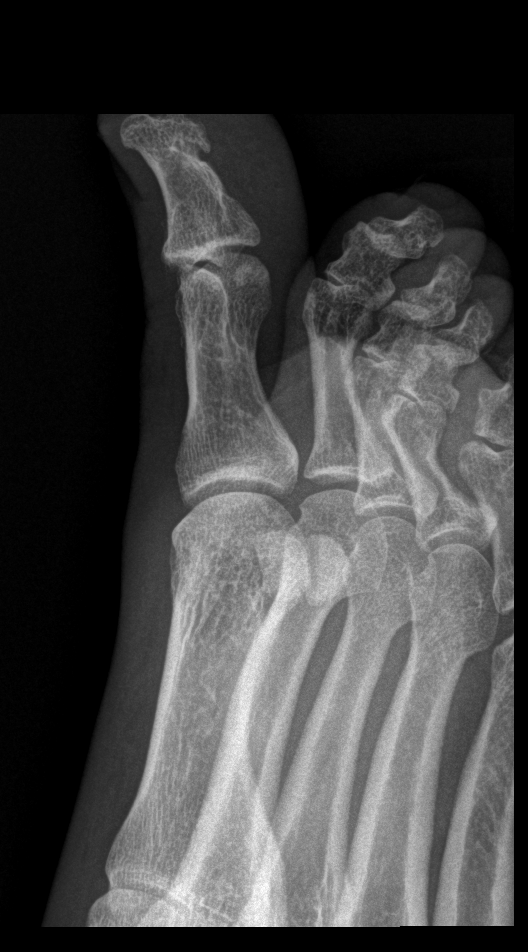

[x toes lat right (2 of 2)]
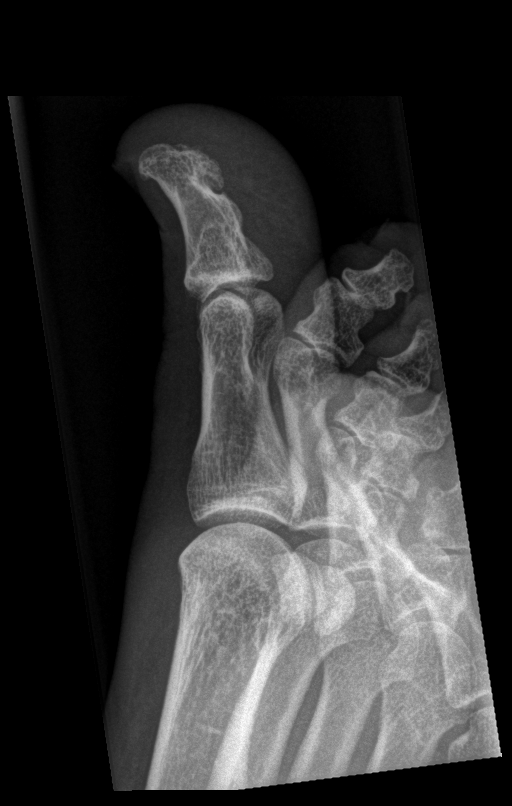

[4 of 4 positions shown; findings below may reference images not displayed]

FINDINGS: There is no evidence of fracture or dislocation. No significant soft
tissue swelling identified. Joint spaces are maintained. No
radiopaque foreign body.
IMPRESSION: No acute fracture or dislocation.

## 2016-10-06 ENCOUNTER — Encounter (HOSPITAL_COMMUNITY): Payer: Self-pay | Admitting: Emergency Medicine

## 2016-10-06 ENCOUNTER — Emergency Department (HOSPITAL_COMMUNITY)
Admission: EM | Admit: 2016-10-06 | Discharge: 2016-10-06 | Disposition: A | Discharge status: Home / Self Care | Payer: Commercial Managed Care - PPO | Attending: Emergency Medicine | Admitting: Emergency Medicine

## 2016-10-06 DIAGNOSIS — Z79899 Other long term (current) drug therapy: Secondary | ICD-10-CM | POA: Insufficient documentation

## 2016-10-06 DIAGNOSIS — R590 Localized enlarged lymph nodes: Secondary | ICD-10-CM

## 2016-10-06 DIAGNOSIS — Z87891 Personal history of nicotine dependence: Secondary | ICD-10-CM | POA: Insufficient documentation

## 2016-10-06 MED ORDER — CEPHALEXIN 500 MG PO CAPS: 500.0000 mg | ORAL_CAPSULE | Freq: Four times a day (QID) | ORAL | 0 refills | Status: DC

## 2016-10-06 NOTE — ED Provider Notes (Signed)
[facility]-EMERGENCY DEPT Provider Note   CSN: [id number] Arrival date & time: 10/06/16  1823     History   Chief Complaint Chief Complaint  Patient presents with  . Abscess    HPI [name] is a 40 y.o. female.  HPI [name] is a 40 y.o. female presents to ED with complaint of an abscess. Patient states that she noticed swelling to the left groin one week ago. Swelling is tender, worse with walking and palpation. She denies any fever or chills. Denies any urinary symptoms. No vaginal discharge or bleeding. Denies any abdominal pain. Denies any pain or wounds to her legs. No treatment prior to coming in. Reports prior access to the vaginal area, states it's feeling the same.  Past Medical History:  Diagnosis Date  . Anxiety     Patient Active Problem List   Diagnosis Date Noted  . Fracture, humerus, shaft 09/12/2011  . Fracture, humerus closed, shaft 09/10/2011    Class: Acute    Past Surgical History:  Procedure Laterality Date  . LITHOTRIPSY    . ORIF HUMERUS FRACTURE  09/12/2011   Procedure: OPEN REDUCTION INTERNAL FIXATION (ORIF) PROXIMAL HUMERUS FRACTURE;  Surgeon: Eldred MangesMark C Yates;  Location: [facility] OR;  Service: Orthopedics;  Laterality: Left;  Open Reduction Internal Fixation Left Humerus  . TONSILLECTOMY      OB History    No data available       Home Medications    Prior to Admission medications   Medication Sig Start Date End Date Taking? Authorizing Provider  acetaminophen (TYLENOL) 325 MG tablet Take 650 mg by mouth every 6 (six) hours as needed for moderate pain.    Historical Provider, MD  HYDROcodone-acetaminophen (NORCO/VICODIN) 5-325 MG per tablet Take 1-2 tablets by mouth every 6 hours as needed for pain. 03/26/14   Nicole Pisciotta, PA-C  levonorgestrel (MIRENA) 20 MCG/24HR IUD 1 each by Intrauterine route once. 11/01/09   Historical Provider, MD    Family History Family History  Problem Relation Age of Onset  . Rheum arthritis Mother   .  Diabetes Mother   . Heart failure Mother     Social History Social History  Substance Use Topics  . Smoking status: Former Smoker    Quit date: 11/15/2010  . Smokeless tobacco: Never Used  . Alcohol use 0.6 oz/week    1 Glasses of wine per week     Allergies   Aspirin   Review of Systems Review of Systems  Constitutional: Negative for chills and fever.  Respiratory: Negative for cough, chest tightness and shortness of breath.   Cardiovascular: Negative for chest pain, palpitations and leg swelling.  Gastrointestinal: Negative for abdominal pain, diarrhea, nausea and vomiting.  Genitourinary: Negative for dysuria, flank pain, pelvic pain, vaginal bleeding, vaginal discharge and vaginal pain.  Musculoskeletal: Negative for arthralgias, myalgias, neck pain and neck stiffness.  Skin: Negative for rash.  Neurological: Negative for dizziness, weakness and headaches.  All other systems reviewed and are negative.    Physical Exam Updated Vital Signs BP 123/59 (BP Location: Left Arm)   Pulse 87   Temp 97.7 F (36.5 C) (Oral)   Resp 16   Ht 5' 7.5" (1.715 m)   Wt 59 kg   SpO2 97%   BMI 20.06 kg/m   Physical Exam  Constitutional: She is oriented to person, place, and time. She appears well-developed and well-nourished. No distress.  HENT:  Head: Normocephalic.  Eyes: Conjunctivae are normal.  Neck: Neck supple.  Cardiovascular:  Normal rate, regular rhythm and normal heart sounds.   Pulmonary/Chest: Effort normal and breath sounds normal. No respiratory distress. She has no wheezes. She has no rales.  Abdominal: Soft. Bowel sounds are normal. She exhibits no distension. There is no tenderness. There is no rebound.  Genitourinary:  Genitourinary Comments: Left inguinal enlarged lymph node, measuring approximately 3 cm x 1 cm diameter. Confirmed by bedside ultrasound.  tender to palpation.   Musculoskeletal: She exhibits no edema.  Neurological: She is alert and oriented to  person, place, and time.  Skin: Skin is warm and dry.  Psychiatric: She has a normal mood and affect. Her behavior is normal.  Nursing note and vitals reviewed.    ED Treatments / Results  Labs (all labs ordered are listed, but only abnormal results are displayed) Labs Reviewed - No data to display  EKG  EKG Interpretation None       Radiology No results found.  Procedures Procedures (including critical care time)  Medications Ordered in ED Medications - No data to display   Initial Impression / Assessment and Plan / ED Course  I have reviewed the triage vital signs and the nursing notes.  Pertinent labs & imaging results that were available during my care of the patient were reviewed by me and considered in my medical decision making (see chart for details).  Clinical Course    Patient in emergency department with enlarged left inguinal lymph node. She denies any dysuria, no vaginal discharge or bleeding, no concern for STI, no concern for intra-abdominal infection. Denies any lesions or pain to her legs. Discussed Dr. Gwenlyn FudgeGoldstein, we used bedside ultrasound to confirm the lymph node. Pt denies any explanation for abscess. Will start on keflex. Home with PCP follow up. intructed to return or see PCP if not improving.   Vitals:   10/06/16 1836  BP: 123/59  Pulse: 87  Resp: 16  Temp: 97.7 F (36.5 C)  TempSrc: Oral  SpO2: 97%  Weight: 59 kg  Height: 5' 7.5" (1.715 m)     Final Clinical Impressions(s) / ED Diagnoses   Final diagnoses:  Inguinal lymphadenopathy    New Prescriptions New Prescriptions   No medications on file     Jaynie Crumbleatyana Giselle Brutus, PA-C 10/07/16 0131    Pricilla LovelessScott Goldston, MD 10/07/16 1500

## 2016-10-06 NOTE — ED Triage Notes (Signed)
Patient reports pain to left groin. States there is a "knot" under the skin x1 week. Reports pain while walking. Hx abscess.

## 2016-10-06 NOTE — Discharge Instructions (Signed)
Ibuprofen for pain and inflammation. Keflex for possible infection until all gone. If not improving, follow up with a family doctor.

## 2019-04-08 ENCOUNTER — Telehealth: Payer: Self-pay | Admitting: Orthopaedic Surgery

## 2019-04-08 NOTE — Telephone Encounter (Signed)
Patient has signed release form for records to be faxed to [facility]. I faxed records [phone]. Patient emailed release form but not as an attachment. I am working on trying to get it printed so I can scan in chart

## 2023-09-12 ENCOUNTER — Other Ambulatory Visit: Payer: Self-pay

## 2023-09-12 ENCOUNTER — Emergency Department (HOSPITAL_COMMUNITY): Payer: Managed Care, Other (non HMO)

## 2023-09-12 ENCOUNTER — Encounter (HOSPITAL_COMMUNITY): Payer: Self-pay

## 2023-09-12 ENCOUNTER — Emergency Department (HOSPITAL_COMMUNITY)
Admission: EM | Admit: 2023-09-12 | Discharge: 2023-09-12 | Disposition: A | Discharge status: Home / Self Care | Payer: Managed Care, Other (non HMO) | Attending: Student | Admitting: Student

## 2023-09-12 DIAGNOSIS — R1110 Vomiting, unspecified: Secondary | ICD-10-CM | POA: Diagnosis not present

## 2023-09-12 DIAGNOSIS — R197 Diarrhea, unspecified: Secondary | ICD-10-CM | POA: Diagnosis not present

## 2023-09-12 DIAGNOSIS — R059 Cough, unspecified: Secondary | ICD-10-CM | POA: Diagnosis present

## 2023-09-12 DIAGNOSIS — R0982 Postnasal drip: Secondary | ICD-10-CM | POA: Insufficient documentation

## 2023-09-12 DIAGNOSIS — R051 Acute cough: Secondary | ICD-10-CM | POA: Diagnosis not present

## 2023-09-12 DIAGNOSIS — Z1152 Encounter for screening for COVID-19: Secondary | ICD-10-CM | POA: Insufficient documentation

## 2023-09-12 LAB — BASIC METABOLIC PANEL
Anion gap: 8 (ref 5–15)
BUN: 14 mg/dL (ref 6–20)
CO2: 23 mmol/L (ref 22–32)
Calcium: 9.2 mg/dL (ref 8.9–10.3)
Chloride: 106 mmol/L (ref 98–111)
Creatinine, Ser: 1.04 mg/dL — ABNORMAL HIGH (ref 0.44–1.00)
GFR, Estimated: >60 mL/min (ref >60)
Glucose, Bld: 132 mg/dL — ABNORMAL HIGH (ref 70–99)
Potassium: 3.6 mmol/L (ref 3.5–5.1)
Sodium: 137 mmol/L (ref 135–145)

## 2023-09-12 LAB — CBC WITH DIFFERENTIAL/PLATELET
Abs Immature Granulocytes: 0.04 10*3/uL (ref 0.00–0.07)
Basophils Absolute: 0.2 10*3/uL — ABNORMAL HIGH (ref 0.0–0.1)
Basophils Relative: 1 %
Eosinophils Absolute: 0.3 10*3/uL (ref 0.0–0.5)
Eosinophils Relative: 2 %
HCT: 40.9 % (ref 36.0–46.0)
Hemoglobin: 13.8 g/dL (ref 12.0–15.0)
Immature Granulocytes: 0 %
Lymphocytes Relative: 22 %
Lymphs Abs: 2.8 10*3/uL (ref 0.7–4.0)
MCH: 32.2 pg (ref 26.0–34.0)
MCHC: 33.7 g/dL (ref 30.0–36.0)
MCV: 95.3 fL (ref 80.0–100.0)
Monocytes Absolute: 1.2 10*3/uL — ABNORMAL HIGH (ref 0.1–1.0)
Monocytes Relative: 10 %
Neutro Abs: 7.9 10*3/uL — ABNORMAL HIGH (ref 1.7–7.7)
Neutrophils Relative %: 65 %
Platelets: 283 10*3/uL (ref 150–400)
RBC: 4.29 MIL/uL (ref 3.87–5.11)
RDW: 13.1 % (ref 11.5–15.5)
WBC: 12.3 10*3/uL — ABNORMAL HIGH (ref 4.0–10.5)
nRBC: 0 % (ref 0.0–0.2)

## 2023-09-12 LAB — RESP PANEL BY RT-PCR (RSV, FLU A&B, COVID)  RVPGX2
Influenza A by PCR: NEGATIVE
Influenza B by PCR: NEGATIVE
Resp Syncytial Virus by PCR: NEGATIVE
SARS Coronavirus 2 by RT PCR: NEGATIVE

## 2023-09-12 LAB — CBG MONITORING, ED: Glucose-Capillary: 127 mg/dL — ABNORMAL HIGH (ref 70–99)

## 2023-09-12 MED ORDER — ALBUTEROL SULFATE HFA 108 (90 BASE) MCG/ACT IN AERS: 2.0000 | INHALATION_SPRAY | RESPIRATORY_TRACT | Status: DC | PRN

## 2023-09-12 NOTE — ED Triage Notes (Signed)
Pt to ED by POV from home with c/o cough onset of one week ago. Pt states that she has had a cough and post nasal drip, also states she has had one episode of vomiting, and "30 minutes of diarrhea", pt also endorses chest discomfort. Arrives A+O, VSS, NADN.

## 2023-09-12 NOTE — ED Provider Notes (Signed)
EMERGENCY DEPARTMENT AT [facility] Provider Note   CSN: [id number] Arrival date & time: 09/12/23  0038     History  Chief Complaint  Patient presents with   Cough    [name] is a 47 y.o. female.  Patient with noncontributory past medical history presents today with complaints of cough, vomiting, and diarrhea. She states that she has had post nasal drip for the past several months and has been treated with antibiotics previously with some improvement but notes that she has occasional episodes where this seems to flare back up again. She states that earlier today she was doing regular activities and felt like she had nasal drainage stuck in the back of her throat and started coughing to try and get rid of the sensation. States that she was coughing so hard she had an episode of vomiting and then had a few bouts of NBNB diarrhea as well. Denies abdominal pain. States she felt generally poor for about 30 minutes and felt like her heart was racing and was concerned prompting her ER visit today. She states that soon after her arrival today she began to feel better and now feels completely back to normal. She has not had another episode of vomiting or diarrhea since. Denies fevers or chills. No sick contacts.   The history is provided by the patient. No language interpreter was used.  Cough      Home Medications Prior to Admission medications   Medication Sig Start Date End Date Taking? Authorizing Provider  acetaminophen (TYLENOL) 325 MG tablet Take 650 mg by mouth every 6 (six) hours as needed for moderate pain.    [provider]  cephALEXin (KEFLEX) 500 MG capsule Take 1 capsule (500 mg total) by mouth 4 (four) times daily. 10/06/16   Kirichenko, Lemont Fillers, PA-C  HYDROcodone-acetaminophen (NORCO/VICODIN) 5-325 MG per tablet Take 1-2 tablets by mouth every 6 hours as needed for pain. 03/26/14   Pisciotta, Joni Reining, PA-C  levonorgestrel (MIRENA) 20  MCG/24HR IUD 1 each by Intrauterine route once. 11/01/09   [provider]      Allergies    Aspirin    Review of Systems   Review of Systems  HENT:  Positive for postnasal drip.   Respiratory:  Positive for cough.   All other systems reviewed and are negative.   Physical Exam Updated Vital Signs BP (!) 116/56   Pulse 66   Temp 98.5 F (36.9 C) (Oral)   Resp 18   SpO2 97%  Physical Exam Vitals and nursing note reviewed.  Constitutional:      General: She is not in acute distress.    Appearance: Normal appearance. She is normal weight. She is not ill-appearing, toxic-appearing or diaphoretic.  HENT:     Head: Normocephalic and atraumatic.     Mouth/Throat:     Mouth: Mucous membranes are moist.  Eyes:     Extraocular Movements: Extraocular movements intact.     Pupils: Pupils are equal, round, and reactive to light.  Cardiovascular:     Rate and Rhythm: Normal rate and regular rhythm.     Heart sounds: Normal heart sounds.  Pulmonary:     Effort: Pulmonary effort is normal. No respiratory distress.     Breath sounds: Normal breath sounds.  Abdominal:     General: Abdomen is flat.     Palpations: Abdomen is soft.     Tenderness: There is no abdominal tenderness.  Musculoskeletal:  General: Normal range of motion.     Cervical back: Normal range of motion and neck supple.     Right lower leg: No edema.     Left lower leg: No edema.  Skin:    General: Skin is warm and dry.  Neurological:     General: No focal deficit present.     Mental Status: She is alert.  Psychiatric:        Mood and Affect: Mood normal.        Behavior: Behavior normal.     ED Results / Procedures / Treatments   Labs (all labs ordered are listed, but only abnormal results are displayed) Labs Reviewed  CBC WITH DIFFERENTIAL/PLATELET - Abnormal; Notable for the following components:      Result Value   WBC 12.3 (*)    Neutro Abs 7.9 (*)    Monocytes Absolute 1.2 (*)     Basophils Absolute 0.2 (*)    All other components within normal limits  BASIC METABOLIC PANEL - Abnormal; Notable for the following components:   Glucose, Bld 132 (*)    Creatinine, Ser 1.04 (*)    All other components within normal limits  CBG MONITORING, ED - Abnormal; Notable for the following components:   Glucose-Capillary 127 (*)    All other components within normal limits  RESP PANEL BY RT-PCR (RSV, FLU A&B, COVID)  RVPGX2    EKG None  Radiology DG Chest 2 View  Result Date: 09/12/2023 CLINICAL DATA:  Chest pain and cough for 1 week EXAM: CHEST - 2 VIEW COMPARISON:  None Available. FINDINGS: The heart size and mediastinal contours are within normal limits. Both lungs are clear. The visualized skeletal structures are unremarkable. IMPRESSION: No active cardiopulmonary disease. Electronically Signed   By: Alcide Clever M.D.   On: 09/12/2023 01:28    Procedures Procedures    Medications Ordered in ED Medications  albuterol (VENTOLIN HFA) 108 (90 Base) MCG/ACT inhaler 2 puff (has no administration in time range)    ED Course/ Medical Decision Making/ A&P                                 Medical Decision Making Amount and/or Complexity of Data Reviewed Labs: ordered. Radiology: ordered.  Risk Prescription drug management.   This patient is a 47 y.o. female  who presents to the ED for concern of cough, postnasal drip, vomiting, diarrhea.   Differential diagnoses prior to evaluation: The emergent differential diagnosis includes, but is not limited to,  URI, pneumonia . This is not an exhaustive differential.   Past Medical History / Co-morbidities / Social History:  has a past medical history of Anxiety.  Additional history: Chart reviewed.  Physical Exam: Physical exam performed. The pertinent findings include: no acute physical exam abnormalities  Lab Tests/Imaging studies: I personally interpreted labs/imaging and the pertinent results include:  WBC 12.3,  glucose 132, creatinine 1.04. RVP negative. CXR  shows NAD. I agree with the radiologist interpretation.  Cardiac monitoring: EKG obtained and interpreted by myself and attending physician which shows: sinus rhythm   Disposition: After consideration of the diagnostic results and the patients response to treatment, I feel that emergency department workup does not suggest an emergent condition requiring admission or immediate intervention beyond what has been performed at this time. The plan is: Discharge with close outpatient follow-up and return precautions.  Patient feeling back to her baseline, therefore no  further evaluation indicated.  OTC recommendations discussed for post nasal drip with flonase and zyrtec. Evaluation and diagnostic testing in the emergency department does not suggest an emergent condition requiring admission or immediate intervention beyond what has been performed at this time.  Plan for discharge with close PCP follow-up.  Patient is understanding and amenable with plan, educated on red flag symptoms that would prompt immediate return.  Patient discharged in stable condition.  Final Clinical Impression(s) / ED Diagnoses Final diagnoses:  Acute cough  Post-nasal drip    Rx / DC Orders ED Discharge Orders     None     An After Visit Summary was printed and given to the patient.     Vear Clock 09/12/23 0413    Glendora Score, MD 09/12/23 [phone]

## 2023-09-12 NOTE — Discharge Instructions (Signed)
As we discussed, your workup in the ER today was reassuring for acute findings.  Laboratory evaluation, EKG, and chest x-ray did not reveal any emergent cause of your symptoms.  I recommend that you follow-up closely with your primary doctor.  In the interim, please focus on symptomatic relief with things such as flonase nasal spray and zyrtec for congestion, cough drops for sore throat and Mucinex.   Return if development of any new or worsening symptoms.

## 2023-09-29 ENCOUNTER — Ambulatory Visit: Payer: Managed Care, Other (non HMO) | Admitting: Internal Medicine

## 2023-09-29 VITALS — BP 116/84 | HR 89 | Temp 98.0°F | Ht 67.5 in | Wt 153.6 lb

## 2023-09-29 DIAGNOSIS — R0982 Postnasal drip: Secondary | ICD-10-CM | POA: Insufficient documentation

## 2023-09-29 DIAGNOSIS — F419 Anxiety disorder, unspecified: Secondary | ICD-10-CM | POA: Insufficient documentation

## 2023-09-29 MED ORDER — AZELASTINE HCL 0.1 % NA SOLN: 1.0000 | Freq: Two times a day (BID) | NASAL | 3 refills | Status: AC

## 2023-09-29 NOTE — Progress Notes (Signed)
[facility] [address] [place] [address] Dept: [phone] Dept Fax: [phone]  New Patient Office Visit  Subjective:   [name] [birth year] 09/29/2023  Chief Complaint  Patient presents with   Establish Care    Sinus issues     HPI: [name] presents today to establish care at [facility] at [facility]. Introduced to Publishing rights manager role and practice setting.  All questions answered.  Concerns: See below   Discussed the use of AI scribe software for clinical note transcription with the patient, who gave verbal consent to proceed.  History of Present Illness   The patient, with a history of anxiety, presents with a chief complaint of persistent postnasal drip that has been ongoing since June 2024. The patient describes the sensation as a constant "bubble" feeling in the throat, leading to frequent throat clearing and gagging. The patient has tried various home remedies, including a neti pot and cough drops, but these have not provided relief. The patient also visited urgent care, where they were prescribed antibiotics and a steroid nasal spray, but these treatments were also ineffective. The patient reports that the postnasal drip symptoms worsen their anxiety, leading to palpitations and a feeling of internal shaking. The patient also mentions a history of family deaths over the past several years and other stressors that have been contributing to their anxiety.         The following portions of the patient's history were reviewed and updated as appropriate: past medical history, past surgical history, family history, social history, allergies, medications, and problem list.   Patient Active Problem List   Diagnosis Date Noted   Post-nasal drip 09/29/2023   Anxiety 09/29/2023   Past Medical History:  Diagnosis Date   Anxiety    Fracture, humerus, shaft 09/12/2011   Past Surgical  History:  Procedure Laterality Date   LITHOTRIPSY     ORIF HUMERUS FRACTURE  09/12/2011   Procedure: OPEN REDUCTION INTERNAL FIXATION (ORIF) PROXIMAL HUMERUS FRACTURE;  Surgeon: Eldred Manges;  Location: [facility] OR;  Service: Orthopedics;  Laterality: Left;  Open Reduction Internal Fixation Left Humerus   TONSILLECTOMY     Family History  Problem Relation Age of Onset   Rheum arthritis Mother    Diabetes Mother    Heart failure Mother     Current Outpatient Medications:    azelastine (ASTELIN) 0.1 % nasal spray, Place 1 spray into both nostrils 2 (two) times daily., Disp: 30 mL, Rfl: 3   levonorgestrel (MIRENA) 20 MCG/24HR IUD, 1 each by Intrauterine route once., Disp: , Rfl:  Allergies  Allergen Reactions   Aspirin Other (See Comments)    Upset stomach    ROS: A complete ROS was performed with pertinent positives/negatives noted in the HPI. The remainder of the ROS are negative.   Objective:   Today's Vitals   09/29/23 1310  BP: 116/84  Pulse: 89  Temp: 98 F (36.7 C)  TempSrc: Temporal  SpO2: 98%  Weight: 153 lb 9.6 oz (69.7 kg)  Height: 5' 7.5" (1.715 m)    GENERAL: Well-appearing, in NAD. Well nourished.  SKIN: Pink, warm and dry. No rash. HEENT:    HEAD: Normocephalic, non-traumatic.  EYES: Conjunctive pink without exudate.  EARS: External ear w/o redness, swelling, masses, or lesions. EAC clear. TM's intact, translucent w/o bulging, appropriate landmarks visualized.  NOSE: Septum midline w/o deformity. Nares patent, mucosa pink and non-inflamed w/o drainage.  THROAT: Uvula midline. Oropharynx with clear  PND. Tonsils non-inflamed w/o exudate. Mucus membranes pink and moist.  NECK: Trachea midline. Full ROM w/o pain or tenderness. No lymphadenopathy.  RESPIRATORY: Chest wall symmetrical. Respirations even and non-labored. Breath sounds clear to auscultation bilaterally.  CARDIAC: S1, S2 present, regular rate and rhythm. Peripheral pulses 2+ bilaterally.   EXTREMITIES:  Without clubbing, cyanosis, or edema.  NEUROLOGIC: Steady, even gait.  PSYCH/MENTAL STATUS: Alert, oriented x 3. Cooperative, appropriate mood and affect.   Health Maintenance Due  Topic Date Due   HIV Screening  Never done   Hepatitis C Screening  Never done   DTaP/Tdap/Td (1 - Tdap) Never done   Cervical Cancer Screening (HPV/Pap Cotest)  Never done   Colonoscopy  Never done   INFLUENZA VACCINE  Never done    No results found for any visits on 09/29/23.  Assessment & Plan:  Assessment and Plan    Postnasal Drip Chronic postnasal drip since June, causing discomfort and triggering anxiety. Tried various home remedies and antibiotics with no significant improvement. Clear drainage observed during examination. -Start Astepro (antihistamine nasal spray)  -Return in six weeks if no improvement is observed.  Anxiety History of anxiety, exacerbated by postnasal drip and recent life stressors. Previous use of Zoloft during a stressful period. - given non-pharmacological interventions patient can do (deep breathing, exercising) in d/c summary -Monitor symptoms and consider intervention if symptoms become more frequent or interfere with daily functioning.  General Health Maintenance -Schedule a full physical examination in three months, including blood work for blood sugar, cholesterol, thyroid, kidney, and liver function. -If necessary, update Pap smear and order mammogram during the physical examination.       No orders of the defined types were placed in this encounter.  Meds ordered this encounter  Medications   azelastine (ASTELIN) 0.1 % nasal spray    Sig: Place 1 spray into both nostrils 2 (two) times daily.    Dispense:  30 mL    Refill:  3    Order Specific Question:   Supervising Provider    Answer:   Garnette Gunner [[id number]]    Return in about 3 months (around 12/28/2023) for Fasting Annual Physical Exam, or sooner if needed.   Salvatore Decent, FNP

## 2023-12-17 DIAGNOSIS — L02413 Cutaneous abscess of right upper limb: Secondary | ICD-10-CM | POA: Diagnosis not present

## 2023-12-28 ENCOUNTER — Encounter: Payer: Commercial Managed Care - PPO | Admitting: Internal Medicine

## 2024-01-04 ENCOUNTER — Ambulatory Visit: Admitting: Internal Medicine

## 2024-01-04 ENCOUNTER — Encounter: Payer: Self-pay | Admitting: Internal Medicine

## 2024-01-04 VITALS — BP 124/72 | HR 84 | Temp 97.7°F | Ht 67.5 in | Wt 146.8 lb

## 2024-01-04 DIAGNOSIS — F419 Anxiety disorder, unspecified: Secondary | ICD-10-CM | POA: Diagnosis not present

## 2024-01-04 DIAGNOSIS — L723 Sebaceous cyst: Secondary | ICD-10-CM

## 2024-01-04 MED ORDER — SERTRALINE HCL 50 MG PO TABS: ORAL_TABLET | ORAL | 1 refills | Status: DC

## 2024-01-04 NOTE — Patient Instructions (Signed)
Counseling and Mental Health Resources   [facility]  - For Women and Girls only - Cost based upon sliding scale of income - Financial Aid available  [phone] [address], [address] [place], [place] [address] [facility]  - Mental Health, Substance Abuse Treatment - IV Ketamine, Hydration and Weight Loss Programs - Center for Treatment for Resistant Depression and Suicidal Ideation  [address] [place], [place] [address]  [phone] [email or web address]   [facility]  - Individual and Family Counseling - Assessments and Therapy for Learning Disabilities, ADHD, Autism Spectrum Disorder, Processing Deficits  [phone] [address], [address] [place], [place] [address]  [facility]  [address] [place], [place] [address]  [phone]  [facility]  - Individual, Family, Play and Group Therapy - In person and telehealth sessions available  Phone: [phone] Email: [email or web address]  [place] Location:   [address] [place], [place] [address]   [place] Location:   [address] [place], [place] [address] [facility]  [address] (Inside old [address]) [place], [place] [address]  [phone]  [facility], [facility]  Adult, Adolescent and [facility]  [address], [place] [address]  Text:  [phone]   Call:  [phone] Email: [email or web address] Su Ley MA Clinical Psychology  [address] [place] [address]  [phone]  [facility]  - Individual, Group Therapy - Day Programs, Wellness Coaching - Staff Programming, Workshops  [address], [place], [place] [address]  [phone]  [facility] [place]  Grief and Trauma Counseling    [facility]  - Indivudual Counseling, Enneagram Therapy [address] [place], [place] [address]  [phone] [facility]  - Children, Adolescent, Adult and Family Therapy  [place] Location [phone]  [address] [place], [place] [address]   [place] Location [phone]  [address] [place] [address]    [facility]  Counseling offered by Art therapist Students  - Majority of Patients qualify for financial assistance   [address] [place], [place] [address]  [phone]   [facility]  -Services at "less than a basic fee" sponsored by [facility]  [address] [place], [place] [address]  [phone]

## 2024-01-04 NOTE — Progress Notes (Signed)
 [facility] [address] [place] [address] Dept: [phone] Dept Fax: [phone]    Subjective:   [name] [birth year] 01/04/2024  Chief Complaint  Patient presents with   Anxiety    HPI: [name] presents today for re-assessment and management of chronic medical conditions.  Discussed the use of AI scribe software for clinical note transcription with the patient, who gave verbal consent to proceed.  History of Present Illness   The patient, with a history of anxiety, presents with a recurring abscess that has been present for a few years. The abscess has flared up twice, most recently being treated with antibiotics. The patient reports that the abscess got really big and pus was coming out. The patient finished the last antibiotic yesterday but still feels something under the abscess site. The patient also reports a history of anxiety, which has been exacerbated by life changes and stressors, including the death of family members and changes in family dynamics. The patient has previously been on Zoloft for anxiety and is considering restarting the medication.           01/04/2024    2:10 PM 09/29/2023    2:01 PM  Depression screen PHQ 2/9  Decreased Interest 0 0  Down, Depressed, Hopeless 1 0  PHQ - 2 Score 1 0  Altered sleeping 0 1  Tired, decreased energy 0 1  Change in appetite 1 1  Feeling bad or failure about yourself  1 0  Trouble concentrating 0 0  Moving slowly or fidgety/restless 0 0  Suicidal thoughts 0 0  PHQ-9 Score 3 3  Difficult doing work/chores Not difficult at all Not difficult at all      01/04/2024    2:10 PM 09/29/2023    2:01 PM  GAD 7 : Generalized Anxiety Score  Nervous, Anxious, on Edge 1 1  Control/stop worrying 2 1  Worry too much - different things 3 1  Trouble relaxing 1 0  Restless 0 0  Easily annoyed or irritable 0 0  Afraid - awful might happen 3 1  Total  GAD 7 Score 10 4  Anxiety Difficulty Not difficult at all Not difficult at all       The following portions of the patient's history were reviewed and updated as appropriate: past medical history, past surgical history, family history, social history, allergies, medications, and problem list.   Patient Active Problem List   Diagnosis Date Noted   Post-nasal drip 09/29/2023   Anxiety 09/29/2023   Past Medical History:  Diagnosis Date   Anxiety    Fracture, humerus, shaft 09/12/2011   Past Surgical History:  Procedure Laterality Date   LITHOTRIPSY     ORIF HUMERUS FRACTURE  09/12/2011   Procedure: OPEN REDUCTION INTERNAL FIXATION (ORIF) PROXIMAL HUMERUS FRACTURE;  Surgeon: Eldred Manges;  Location: [facility] OR;  Service: Orthopedics;  Laterality: Left;  Open Reduction Internal Fixation Left Humerus   TONSILLECTOMY     Family History  Problem Relation Age of Onset   Rheum arthritis Mother    Diabetes Mother    Heart failure Mother     Current Outpatient Medications:    azelastine (ASTELIN) 0.1 % nasal spray, Place 1 spray into both nostrils 2 (two) times daily., Disp: 30 mL, Rfl: 3   levonorgestrel (MIRENA) 20 MCG/24HR IUD, 1 each by Intrauterine route once., Disp: , Rfl:    sertraline (ZOLOFT) 50 MG tablet, Take 1/2 tablet by mouth once daily  for 5 days. Then take 1 tablet by mouth once daily., Disp: 90 tablet, Rfl: 1 Allergies  Allergen Reactions   Aspirin Other (See Comments)    Upset stomach     ROS: A complete ROS was performed with pertinent positives/negatives noted in the HPI. The remainder of the ROS are negative.    Objective:   Today's Vitals   01/04/24 1411  BP: 124/72  Pulse: 84  Temp: 97.7 F (36.5 C)  TempSrc: Temporal  SpO2: 97%  Weight: 146 lb 12.8 oz (66.6 kg)  Height: 5' 7.5" (1.715 m)    GENERAL: Well-appearing, in NAD. Well nourished.  SKIN: Pink, warm and dry. 0.5cm moveable, nontender cyst to R. Lateral neck  NECK: Trachea midline. Full ROM  w/o pain or tenderness. No lymphadenopathy.  RESPIRATORY: Chest wall symmetrical. Respirations even and non-labored. Breath sounds clear to auscultation bilaterally.  CARDIAC: S1, S2 present, regular rate and rhythm. Peripheral pulses 2+ bilaterally.  EXTREMITIES: Without clubbing, cyanosis, or edema.  NEUROLOGIC: No motor or sensory deficits. Steady, even gait.  PSYCH/MENTAL STATUS: Alert, oriented x 3. Cooperative, appropriate mood and affect.   Health Maintenance Due  Topic Date Due   HIV Screening  Never done   Hepatitis C Screening  Never done   DTaP/Tdap/Td (1 - Tdap) Never done   Cervical Cancer Screening (HPV/Pap Cotest)  Never done   Colonoscopy  Never done   INFLUENZA VACCINE  Never done   COVID-19 Vaccine (3 - 2024-25 season) 06/28/2023    No results found for any visits on 01/04/24.  The ASCVD Risk score (Arnett DK, et al., 2019) failed to calculate for the following reasons:   Cannot find a previous HDL lab   Cannot find a previous total cholesterol lab     Assessment & Plan:  Assessment and Plan    Sebaceous Cyst  History of recurrent neck cyst with recent flare up and drainage. Completed a course of antibiotics. Likely a sebaceous cyst that periodically becomes inflamed and drains. -Refer to general surgery for evaluation and possible excision.  Anxiety History of anxiety with recent exacerbation due to multiple stressors including family health issues and deaths. Previous successful treatment with Zoloft. -Start Zoloft 25mg  daily for 5 days, then increase to 50mg  daily. -Refer to counseling for additional support and coping strategies. -Follow up in 5 weeks to assess response to medication and overall mental health status.       Orders Placed This Encounter  Procedures   Ambulatory referral to General Surgery    Referral Priority:   Routine    Referral Type:   Surgical    Referral Reason:   Specialty Services Required    Referred to Provider:   Darnell Level, MD    Requested Specialty:   General Surgery    Number of Visits Requested:   1   Ambulatory referral to Psychology    Referral Priority:   Routine    Referral Type:   Psychiatric    Referral Reason:   Specialty Services Required    Requested Specialty:   Psychology    Number of Visits Requested:   1   No images are attached to the encounter or orders placed in the encounter. Meds ordered this encounter  Medications   sertraline (ZOLOFT) 50 MG tablet    Sig: Take 1/2 tablet by mouth once daily for 5 days. Then take 1 tablet by mouth once daily.    Dispense:  90 tablet    Refill:  1    Supervising Provider:   Garnette Gunner [[id number]]    Return in about 5 weeks (around 02/08/2024) for Anxiety/Depression (video visit) .   Salvatore Decent, FNP

## 2024-01-05 ENCOUNTER — Encounter: Payer: Self-pay | Admitting: Internal Medicine

## 2024-01-18 ENCOUNTER — Encounter: Payer: Self-pay | Admitting: Internal Medicine

## 2024-01-18 ENCOUNTER — Ambulatory Visit (INDEPENDENT_AMBULATORY_CARE_PROVIDER_SITE_OTHER): Admitting: Internal Medicine

## 2024-01-18 ENCOUNTER — Other Ambulatory Visit (HOSPITAL_COMMUNITY)
Admission: RE | Admit: 2024-01-18 | Discharge: 2024-01-18 | Disposition: A | Discharge status: Home / Self Care | Source: Ambulatory Visit | Attending: Internal Medicine | Admitting: Internal Medicine

## 2024-01-18 VITALS — BP 120/76 | HR 76 | Temp 97.7°F | Ht 67.5 in | Wt 146.4 lb

## 2024-01-18 DIAGNOSIS — Z1159 Encounter for screening for other viral diseases: Secondary | ICD-10-CM | POA: Diagnosis not present

## 2024-01-18 DIAGNOSIS — Z0000 Encounter for general adult medical examination without abnormal findings: Secondary | ICD-10-CM

## 2024-01-18 DIAGNOSIS — Z1231 Encounter for screening mammogram for malignant neoplasm of breast: Secondary | ICD-10-CM

## 2024-01-18 DIAGNOSIS — Z124 Encounter for screening for malignant neoplasm of cervix: Secondary | ICD-10-CM | POA: Insufficient documentation

## 2024-01-18 DIAGNOSIS — Z23 Encounter for immunization: Secondary | ICD-10-CM | POA: Diagnosis not present

## 2024-01-18 DIAGNOSIS — Z1211 Encounter for screening for malignant neoplasm of colon: Secondary | ICD-10-CM

## 2024-01-18 DIAGNOSIS — Z975 Presence of (intrauterine) contraceptive device: Secondary | ICD-10-CM

## 2024-01-18 DIAGNOSIS — Z114 Encounter for screening for human immunodeficiency virus [HIV]: Secondary | ICD-10-CM | POA: Diagnosis not present

## 2024-01-18 LAB — TSH: TSH: 1.81 u[IU]/mL (ref 0.35–5.50)

## 2024-01-18 LAB — CBC WITH DIFFERENTIAL/PLATELET
Basophils Absolute: 0.1 10*3/uL (ref 0.0–0.1)
Basophils Relative: 1.8 % (ref 0.0–3.0)
Eosinophils Absolute: 0.1 10*3/uL (ref 0.0–0.7)
Eosinophils Relative: 1.6 % (ref 0.0–5.0)
HCT: 43.8 % (ref 36.0–46.0)
Hemoglobin: 14.6 g/dL (ref 12.0–15.0)
Lymphocytes Relative: 15.5 % (ref 12.0–46.0)
Lymphs Abs: 1.2 10*3/uL (ref 0.7–4.0)
MCHC: 33.3 g/dL (ref 30.0–36.0)
MCV: 95.2 fl (ref 78.0–100.0)
Monocytes Absolute: 0.7 10*3/uL (ref 0.1–1.0)
Monocytes Relative: 8.9 % (ref 3.0–12.0)
Neutro Abs: 5.7 10*3/uL (ref 1.4–7.7)
Neutrophils Relative %: 72.2 % (ref 43.0–77.0)
Platelets: 262 10*3/uL (ref 150.0–400.0)
RBC: 4.6 Mil/uL (ref 3.87–5.11)
RDW: 12.9 % (ref 11.5–15.5)
WBC: 7.9 10*3/uL (ref 4.0–10.5)

## 2024-01-18 LAB — LIPID PANEL
Cholesterol: 190 mg/dL (ref 0–200)
HDL: 59.1 mg/dL (ref >39.00)
LDL Cholesterol: 96 mg/dL (ref 0–99)
NonHDL: 131.31
Total CHOL/HDL Ratio: 3
Triglycerides: 176 mg/dL — ABNORMAL HIGH (ref 0.0–149.0)
VLDL: 35.2 mg/dL (ref 0.0–40.0)

## 2024-01-18 LAB — COMPREHENSIVE METABOLIC PANEL
ALT: 11 U/L (ref 0–35)
AST: 17 U/L (ref 0–37)
Albumin: 4.7 g/dL (ref 3.5–5.2)
Alkaline Phosphatase: 63 U/L (ref 39–117)
BUN: 9 mg/dL (ref 6–23)
CO2: 28 meq/L (ref 19–32)
Calcium: 9.9 mg/dL (ref 8.4–10.5)
Chloride: 102 meq/L (ref 96–112)
Creatinine, Ser: 0.8 mg/dL (ref 0.40–1.20)
GFR: 86.88 mL/min (ref >60.00)
Glucose, Bld: 104 mg/dL — ABNORMAL HIGH (ref 70–99)
Potassium: 3.9 meq/L (ref 3.5–5.1)
Sodium: 139 meq/L (ref 135–145)
Total Bilirubin: 0.7 mg/dL (ref 0.2–1.2)
Total Protein: 7.8 g/dL (ref 6.0–8.3)

## 2024-01-18 NOTE — Progress Notes (Signed)
 Subjective:   [name] 12-17-1975  01/18/2024   CC: Chief Complaint  Patient presents with   Annual Exam    Fasting     HPI: [name] is a 48 y.o. female who presents for a routine health maintenance exam.  Labs collected at time of visit.   Needs referral to OBGYN for Mirena removal.    HEALTH SCREENINGS: - Pap smear: pap done - Mammogram (40+): Ordered today  - Colonoscopy (45+): Ordered today  - Bone Density (65+): Not applicable  - Lung CA screening with low-dose CT:  Not applicable Adults age 43-80 who are current cigarette smokers or quit within the last 15 years. Must have 20 pack year history.   Depression and Anxiety Screen done today and results listed below:     01/18/2024    9:18 AM 01/04/2024    2:10 PM 09/29/2023    2:01 PM  Depression screen PHQ 2/9  Decreased Interest 0 0 0  Down, Depressed, Hopeless 0 1 0  PHQ - 2 Score 0 1 0  Altered sleeping 1 0 1  Tired, decreased energy 1 0 1  Change in appetite 1 1 1   Feeling bad or failure about yourself  0 1 0  Trouble concentrating 0 0 0  Moving slowly or fidgety/restless 0 0 0  Suicidal thoughts 0 0 0  PHQ-9 Score 3 3 3   Difficult doing work/chores Not difficult at all Not difficult at all Not difficult at all      01/18/2024    9:18 AM 01/04/2024    2:10 PM 09/29/2023    2:01 PM  GAD 7 : Generalized Anxiety Score  Nervous, Anxious, on Edge 0 1 1  Control/stop worrying 0 2 1  Worry too much - different things 0 3 1  Trouble relaxing 0 1 0  Restless 0 0 0  Easily annoyed or irritable 0 0 0  Afraid - awful might happen 0 3 1  Total GAD 7 Score 0 10 4  Anxiety Difficulty Not difficult at all Not difficult at all Not difficult at all    IMMUNIZATIONS: - Tdap: Tetanus vaccination status reviewed: last tetanus booster within 10 years. - HPV: Not applicable - Influenza: Refused   Past medical history, surgical history, medications, allergies, family history and social history reviewed  with patient today and changes made to appropriate areas of the chart.   Past Medical History:  Diagnosis Date   Anxiety    Fracture, humerus, shaft 09/12/2011    Past Surgical History:  Procedure Laterality Date   LITHOTRIPSY     ORIF HUMERUS FRACTURE  09/12/2011   Procedure: OPEN REDUCTION INTERNAL FIXATION (ORIF) PROXIMAL HUMERUS FRACTURE;  Surgeon: Eldred Manges;  Location: [facility] OR;  Service: Orthopedics;  Laterality: Left;  Open Reduction Internal Fixation Left Humerus   TONSILLECTOMY      Current Outpatient Medications on File Prior to Visit  Medication Sig   azelastine (ASTELIN) 0.1 % nasal spray Place 1 spray into both nostrils 2 (two) times daily. (Patient taking differently: Place 1 spray into both nostrils as needed.)   levonorgestrel (MIRENA) 20 MCG/24HR IUD 1 each by Intrauterine route once.   sertraline (ZOLOFT) 50 MG tablet Take 1/2 tablet by mouth once daily for 5 days. Then take 1 tablet by mouth once daily.   No current facility-administered medications on file prior to visit.    Allergies  Allergen Reactions   Aspirin Other (See Comments)    Upset stomach  Social History   Socioeconomic History   Marital status: Married    Spouse name: Not on file   Number of children: Not on file   Years of education: Not on file   Highest education level: Some college, no degree  Occupational History   Not on file  Tobacco Use   Smoking status: Former    Current packs/day: 0.00    Types: Cigarettes    Quit date: 11/15/2000    Years since quitting: 23.1   Smokeless tobacco: Never  Substance and Sexual Activity   Alcohol use: Yes    Alcohol/week: 1.0 standard drink of alcohol    Types: 1 Glasses of wine per week    Comment: socially   Drug use: No   Sexual activity: Yes    Birth control/protection: I.U.D.  Other Topics Concern   Not on file  Social History Narrative   Not on file   Social Drivers of Health   Financial Resource Strain: Low Risk   (01/03/2024)   Overall Financial Resource Strain (CARDIA)    Difficulty of Paying Living Expenses: Not hard at all  Food Insecurity: No Food Insecurity (01/03/2024)   Hunger Vital Sign    Worried About Running Out of Food in the Last Year: Never true    Ran Out of Food in the Last Year: Never true  Transportation Needs: No Transportation Needs (01/03/2024)   PRAPARE - Administrator, Civil Service (Medical): No    Lack of Transportation (Non-Medical): No  Physical Activity: Sufficiently Active (01/03/2024)   Exercise Vital Sign    Days of Exercise per Week: 3 days    Minutes of Exercise per Session: 50 min  Stress: Stress Concern Present (01/03/2024)   [employer] of Occupational Health - Occupational Stress Questionnaire    Feeling of Stress : To some extent  Social Connections: Moderately Isolated (01/03/2024)   Social Connection and Isolation Panel [NHANES]    Frequency of Communication with Friends and Family: More than three times a week    Frequency of Social Gatherings with Friends and Family: More than three times a week    Attends Religious Services: Never    Database administrator or Organizations: No    Attends Engineer, structural: Not on file    Marital Status: Married  Catering manager Violence: Not on file   Social History   Tobacco Use  Smoking Status Former   Current packs/day: 0.00   Types: Cigarettes   Quit date: 11/15/2000   Years since quitting: 23.1  Smokeless Tobacco Never   Social History   Substance and Sexual Activity  Alcohol Use Yes   Alcohol/week: 1.0 standard drink of alcohol   Types: 1 Glasses of wine per week   Comment: socially    Family History  Problem Relation Age of Onset   Rheum arthritis Mother    Diabetes Mother    Heart failure Mother      ROS: Denies fever, fatigue, unexplained weight loss/gain, hearing or vision changes, cardiac or respiratory complaints. Denies neurological deficits, musculoskeletal  complaints, gastrointestinal or genitourinary complaints, mental health complaints, and skin changes.   Objective:   Today's Vitals   01/18/24 0915  BP: 120/76  Pulse: 76  Temp: 97.7 F (36.5 C)  TempSrc: Temporal  SpO2: 98%  Weight: 146 lb 6.4 oz (66.4 kg)  Height: 5' 7.5" (1.715 m)    GENERAL APPEARANCE: Well-appearing, in NAD. Well nourished.  SKIN: Pink, warm and dry. Turgor  normal. No rash, lesion, ulceration, or ecchymoses. Hair evenly distributed.  HEENT: HEAD: Normocephalic.  EYES: PERRLA. EOMI. Lids intact w/o defect. Sclera white, Conjunctiva pink w/o exudate.  EARS: External ear w/o redness, swelling, masses or lesions. EAC clear. TM's intact, translucent w/o bulging, appropriate landmarks visualized. Appropriate acuity to conversational tones.  NOSE: Septum midline w/o deformity. Nares patent, mucosa pink and non-inflamed w/o drainage. No sinus tenderness.  THROAT: Uvula midline. Oropharynx clear. Tonsils absent. Oral mucosa pink and moist.  NECK: Supple, Trachea midline. Full ROM w/o pain or tenderness. No lymphadenopathy. Thyroid non-tender w/o enlargement or palpable masses.  BREASTS: Breasts pendulous, symmetrical, and w/o palpable masses. Nipples everted and w/o discharge. No rash or skin retraction. No axillary or supraclavicular lymphadenopathy.  RESPIRATORY: Chest wall symmetrical w/o masses. Respirations even and non-labored. Breath sounds clear to auscultation bilaterally. No wheezes, rales, rhonchi, or crackles. CARDIAC: S1, S2 present, regular rate and rhythm. No gallops, murmurs, rubs, or clicks.  Capillary refill <2 seconds. Peripheral pulses 2+ bilaterally. GI: Abdomen soft w/o distention. Normoactive bowel sounds. No palpable masses or tenderness. No guarding or rebound tenderness. Liver and spleen w/o tenderness or enlargement. No CVA tenderness.  GU:  External genitalia without erythema, lesions, or masses. No lymphadenopathy. Vaginal mucosa pink and moist  without exudate, lesions, or ulcerations. Cervix pink without discharge. Cervical os closed, red discoloration noted at cervical os. IUD string visualized. Uterus and adnexae palpable, not enlarged, and w/o tenderness. No palpable masses.  MSK: Muscle tone and strength appropriate for age, w/o atrophy or abnormal movement.  EXTREMITIES: Active ROM intact, w/o tenderness, crepitus, or contracture. No obvious joint deformities or effusions. No clubbing, edema, or cyanosis.  NEUROLOGIC: CN's II-XII intact. Motor strength symmetrical with no obvious weakness. No sensory deficits.  Steady, even gait.  PSYCH/MENTAL STATUS: Alert, oriented x 3. Cooperative, appropriate mood and affect.   Chaperoned by Mary Sella CMA    Assessment & Plan:  Encounter for general adult medical examination without abnormal findings -     CBC with Differential/Platelet -     Comprehensive metabolic panel -     TSH -     Lipid panel  Colon cancer screening -     Ambulatory referral to Gastroenterology  Breast cancer screening by mammogram -     3D Screening Mammogram w/Implants, Left and Right; Future  Cervical cancer screening -     Cytology - PAP  Encounter for screening for HIV -     HIV Antibody (routine testing w rflx)  Need for hepatitis C screening test -     Hepatitis C antibody  Immunization due -     Tdap vaccine greater than or equal to 7yo IM  IUD (intrauterine device) in place -     Ambulatory referral to Obstetrics / Gynecology    Orders Placed This Encounter  Procedures   MM 3D SCREENING MAMMOGRAM BILATERAL BREAST W/IMPLANT    Standing Status:   Future    Expiration Date:   01/17/2025    Reason for Exam (SYMPTOM  OR DIAGNOSIS REQUIRED):   screening for breast cancer    Is the patient pregnant?:   No    Preferred [facility]   Tdap vaccine greater than or equal to 7yo IM   CBC with Differential/Platelet   Comprehensive metabolic panel   TSH   Lipid panel    HIV antibody (with reflex)   Hepatitis C antibody   Ambulatory referral to Gastroenterology  Referral Priority:   Routine    Referral Type:   Consultation    Referral Reason:   Specialty Services Required    Number of Visits Requested:   1   Ambulatory referral to Obstetrics / Gynecology    Referral Priority:   Routine    Referral Type:   Consultation    Referral Reason:   Specialty Services Required    Requested Specialty:   Obstetrics and Gynecology    Number of Visits Requested:   1    PATIENT COUNSELING:  - Encouraged a healthy well-balanced diet. Patient may adjust caloric intake to maintain or achieve ideal body weight. May reduce intake of dietary saturated fat and total fat and have adequate dietary potassium and calcium preferably from fresh fruits, vegetables, and low-fat dairy products.   - Advised to avoid cigarette smoking. - Discussed with the patient that most people either abstain from alcohol or drink within safe limits (<=14/week and <=4 drinks/occasion for males, <=7/weeks and <= 3 drinks/occasion for females) and that the risk for alcohol disorders and other health effects rises proportionally with the number of drinks per week and how often a drinker exceeds daily limits. - Discussed cessation/primary prevention of drug use and availability of treatment for abuse.  - Discussed sexually transmitted diseases, avoidance of unintended pregnancy and contraceptive alternatives. - Stressed the importance of regular exercise - Injury prevention: Discussed safety belts, safety helmets, smoke detector, smoking near bedding or upholstery.   NEXT PREVENTATIVE PHYSICAL DUE IN 1 YEAR.  Return in about 3 months (around 04/19/2024) for Anxiety/Depression.  Salvatore Decent, FNP

## 2024-01-19 LAB — HEPATITIS C ANTIBODY: Hepatitis C Ab: NONREACTIVE

## 2024-01-19 LAB — HIV ANTIBODY (ROUTINE TESTING W REFLEX): HIV 1&2 Ab, 4th Generation: NONREACTIVE

## 2024-01-20 ENCOUNTER — Encounter: Payer: Self-pay | Admitting: Internal Medicine

## 2024-01-20 ENCOUNTER — Other Ambulatory Visit: Payer: Self-pay | Admitting: Internal Medicine

## 2024-01-20 ENCOUNTER — Ambulatory Visit: Payer: Self-pay | Admitting: Surgery

## 2024-01-20 DIAGNOSIS — L720 Epidermal cyst: Secondary | ICD-10-CM | POA: Diagnosis not present

## 2024-01-20 DIAGNOSIS — R7301 Impaired fasting glucose: Secondary | ICD-10-CM

## 2024-01-20 LAB — CYTOLOGY - PAP
Comment: NEGATIVE
Diagnosis: UNDETERMINED — AB
High risk HPV: NEGATIVE

## 2024-02-15 ENCOUNTER — Ambulatory Visit
Admission: RE | Admit: 2024-02-15 | Discharge: 2024-02-15 | Disposition: A | Discharge status: Home / Self Care | Source: Ambulatory Visit | Attending: Internal Medicine | Admitting: Internal Medicine

## 2024-02-15 DIAGNOSIS — Z1231 Encounter for screening mammogram for malignant neoplasm of breast: Secondary | ICD-10-CM

## 2024-02-18 ENCOUNTER — Encounter: Payer: Self-pay | Admitting: Internal Medicine

## 2024-02-29 ENCOUNTER — Encounter (HOSPITAL_BASED_OUTPATIENT_CLINIC_OR_DEPARTMENT_OTHER): Payer: Self-pay | Admitting: Surgery

## 2024-02-29 ENCOUNTER — Other Ambulatory Visit: Payer: Self-pay

## 2024-02-29 DIAGNOSIS — L720 Epidermal cyst: Secondary | ICD-10-CM | POA: Diagnosis present

## 2024-02-29 NOTE — H&P (Signed)
 REFERRING PHYSICIAN: Gavin Kast, FNP  PROVIDER: Angus Amini Arcola Kocher, MD   Chief Complaint: New Consultation (Sebaceous cyst right lateral neck)  History of Present Illness:  Patient is referred by her primary care provider, Gavin Kast, NP, for surgical evaluation and management of a epidermal cyst in the right lateral neck with recurrent episodes of infection. Patient first had an episode of infection at this location several years ago. It spontaneously drained. It was treated successfully of antibiotics. Patient recently had another episode of inflammation requiring antibiotics followed by spontaneous drainage. Patient is now referred to consider definitive surgical excision. She admits to significant medical anxiety. Patient has not had any other lesions except for many years ago a similar type lesion in the right groin which resolved completely.  Review of Systems: A complete review of systems was obtained from the patient. I have reviewed this information and discussed as appropriate with the patient. See HPI as well for other ROS.  Review of Systems  Constitutional: Negative.  HENT: Negative.  Eyes: Negative.  Respiratory: Negative.  Cardiovascular: Negative.  Gastrointestinal: Negative.  Genitourinary: Negative.  Musculoskeletal: Negative.  Skin:  Recurrent infection right lateral neck  Neurological: Negative.  Endo/Heme/Allergies: Negative.  Psychiatric/Behavioral: Negative.    Medical History: Past Medical History:  Diagnosis Date  Anxiety   Patient Active Problem List  Diagnosis  Epidermal cyst of neck   Past Surgical History:  Procedure Laterality Date  Left arm plate, upper arm 08/2011  breast implants 11/2017    Allergies  Allergen Reactions  Aspirin Other (See Comments)  Upset stomach   Current Outpatient Medications on File Prior to Visit  Medication Sig Dispense Refill  azelastine  (ASTELIN ) 137 mcg nasal spray Place 1 spray into one  nostril 2 (two) times daily  sertraline  (ZOLOFT ) 50 MG tablet Take 1/2 tablet by mouth once daily for 5 days. Then take 1 tablet by mouth once daily.   No current facility-administered medications on file prior to visit.   Family History  Problem Relation Age of Onset  Obesity Mother  Diabetes Mother    Social History   Tobacco Use  Smoking Status Former  Types: Cigarettes  Smokeless Tobacco Never    Social History   Socioeconomic History  Marital status: Married  Tobacco Use  Smoking status: Former  Types: Cigarettes  Smokeless tobacco: Never  Vaping Use  Vaping status: Never Used  Substance and Sexual Activity  Alcohol use: Yes  Drug use: Never   Social Drivers of Corporate investment banker Strain: Low Risk (01/03/2024)  Received from [facility]  Overall Financial Resource Strain (CARDIA)  Difficulty of Paying Living Expenses: Not hard at all  Food Insecurity: No Food Insecurity (01/03/2024)  Received from [facility]  Hunger Vital Sign  Worried About Running Out of Food in the Last Year: Never true  Ran Out of Food in the Last Year: Never true  Transportation Needs: No Transportation Needs (01/03/2024)  Received from [facility] - Transportation  Lack of Transportation (Medical): No  Lack of Transportation (Non-Medical): No  Physical Activity: Sufficiently Active (01/03/2024)  Received from [facility]  Exercise Vital Sign  Days of Exercise per Week: 3 days  Minutes of Exercise per Session: 50 min  Stress: Stress Concern Present (01/03/2024)  Received from [facility] of Occupational Health - Occupational Stress Questionnaire  Feeling of Stress : To some extent  Social Connections: Moderately Isolated (01/03/2024)  Received from [facility]  Social Advertising account executive [NHANES]  Frequency of Communication with Friends and Family: More than three times a week  Frequency of Social Gatherings with Friends and Family: More than  three times a week  Attends Religious Services: Never  Database administrator or Organizations: No  Marital Status: Married  Housing Stability: Unknown (01/20/2024)  Housing Stability Vital Sign  Homeless in the Last Year: No   Objective:   Vitals:  BP: 126/81  Pulse: 80  Temp: 36.6 C (97.9 F)  SpO2: 98%  Weight: 66.8 kg (147 lb 3.2 oz)  Height: 171.5 cm (5' 7.5")  PainSc: 0-No pain   Body mass index is 22.71 kg/m.  Physical Exam   GENERAL APPEARANCE Comfortable, no acute issues Development: normal Gross deformities: none  SKIN Rash, lesions, ulcers: none Induration, erythema: none Nodules: There is a 1 cm raised nodular mass in the right lateral neck with a small scar at the anterior edge consistent with an epidermal inclusion cyst. This is not acutely inflamed at this time.  EYES Conjunctiva and lids: normal Pupils: equal  EARS, NOSE, MOUTH, THROAT External ears: no lesion or deformity External nose: no lesion or deformity Hearing: grossly normal  NECK Symmetric: yes Trachea: midline Thyroid: no palpable nodules in the thyroid bed  CHEST/CV Not assessed  ABDOMEN Not assessed  GENITOURINARY/RECTAL Not assessed  MUSCULOSKELETAL Station and gait: normal Digits and nails: no clubbing or cyanosis Muscle strength: grossly normal all extremities Deformity: none  LYMPHATIC Cervical: none palpable Supraclavicular: none palpable  PSYCHIATRIC Oriented to person, place, and time: yes Mood and affect: normal for situation Judgment and insight: appropriate for situation   Assessment and Plan:   Epidermal cyst of neck  Patient is referred by her primary care provider for surgical evaluation and management of an epidermal cyst in the right lateral neck with a history of recurrent infection. She would like to have this surgically excised.  We discussed the procedure. We discussed the size and location of the surgical incision. We discussed doing this  under local anesthesia with intravenous sedation due to her anxiety. This would be performed as an outpatient surgical procedure. We discussed operative wound care. The patient understands and wishes to proceed with this procedure in the near future.  Oralee Billow, MD [facility] practice Office: [phone]

## 2024-03-03 MED ORDER — CHLORHEXIDINE GLUCONATE CLOTH 2 % EX PADS: 6.0000 | MEDICATED_PAD | Freq: Once | CUTANEOUS | Status: DC

## 2024-03-03 MED ORDER — CHLORHEXIDINE GLUCONATE CLOTH 2 % EX PADS
6.0000 | MEDICATED_PAD | Freq: Once | CUTANEOUS | Status: DC
Start: 2024-03-03 — End: 2024-03-04

## 2024-03-03 NOTE — Progress Notes (Signed)

## 2024-03-03 NOTE — Anesthesia Preprocedure Evaluation (Addendum)
 Anesthesia Evaluation  Patient identified by MRN, date of birth, ID band Patient awake    Reviewed: Allergy & Precautions, NPO status , Patient's Chart, lab work & pertinent test results  History of Anesthesia Complications Negative for: history of anesthetic complications  Airway Mallampati: II  TM Distance: >3 FB Neck ROM: Full    Dental  (+) Dental Advisory Given   Pulmonary neg pulmonary ROS, former smoker   breath sounds clear to auscultation       Cardiovascular negative cardio ROS  Rhythm:Regular Rate:Normal     Neuro/Psych    GI/Hepatic negative GI ROS, Neg liver ROS,,,  Endo/Other  negative endocrine ROS    Renal/GU negative Renal ROS     Musculoskeletal   Abdominal   Peds  Hematology negative hematology ROS (+)   Anesthesia Other Findings   Reproductive/Obstetrics                             Anesthesia Physical Anesthesia Plan  ASA: 2  Anesthesia Plan: MAC   Post-op Pain Management: Tylenol  PO (pre-op)*   Induction:   PONV Risk Score and Plan: 2 and Ondansetron  and Treatment may vary due to age or medical condition  Airway Management Planned: Natural Airway and Simple Face Mask  Additional Equipment: None  Intra-op Plan:   Post-operative Plan:   Informed Consent: I have reviewed the patients History and Physical, chart, labs and discussed the procedure including the risks, benefits and alternatives for the proposed anesthesia with the patient or authorized representative who has indicated his/her understanding and acceptance.     Dental advisory given  Plan Discussed with: CRNA and Surgeon  Anesthesia Plan Comments:         Anesthesia Quick Evaluation

## 2024-03-04 ENCOUNTER — Encounter (HOSPITAL_BASED_OUTPATIENT_CLINIC_OR_DEPARTMENT_OTHER): Payer: Self-pay | Admitting: Surgery

## 2024-03-04 ENCOUNTER — Ambulatory Visit (HOSPITAL_BASED_OUTPATIENT_CLINIC_OR_DEPARTMENT_OTHER)
Admission: RE | Admit: 2024-03-04 | Discharge: 2024-03-04 | Disposition: A | Discharge status: Home / Self Care | Attending: Surgery | Admitting: Surgery

## 2024-03-04 ENCOUNTER — Other Ambulatory Visit: Payer: Self-pay

## 2024-03-04 ENCOUNTER — Ambulatory Visit (HOSPITAL_BASED_OUTPATIENT_CLINIC_OR_DEPARTMENT_OTHER): Payer: Self-pay | Admitting: Anesthesiology

## 2024-03-04 ENCOUNTER — Encounter (HOSPITAL_BASED_OUTPATIENT_CLINIC_OR_DEPARTMENT_OTHER)
Admission: RE | Disposition: A | Discharge status: Home / Self Care | Payer: Self-pay | Source: Home / Self Care | Attending: Surgery

## 2024-03-04 DIAGNOSIS — L720 Epidermal cyst: Secondary | ICD-10-CM | POA: Diagnosis not present

## 2024-03-04 DIAGNOSIS — F419 Anxiety disorder, unspecified: Secondary | ICD-10-CM | POA: Insufficient documentation

## 2024-03-04 DIAGNOSIS — Z01818 Encounter for other preprocedural examination: Secondary | ICD-10-CM

## 2024-03-04 DIAGNOSIS — Z87891 Personal history of nicotine dependence: Secondary | ICD-10-CM | POA: Insufficient documentation

## 2024-03-04 HISTORY — PX: EXCISION MASS NECK: SHX6703

## 2024-03-04 LAB — POCT PREGNANCY, URINE: Preg Test, Ur: NEGATIVE

## 2024-03-04 SURGERY — EXCISION, MASS, NECK
Anesthesia: Monitor Anesthesia Care | Site: Neck | Laterality: Right

## 2024-03-04 MED ORDER — FENTANYL CITRATE (PF) 100 MCG/2ML IJ SOLN
INTRAMUSCULAR | Status: AC
  Filled 2024-03-04: qty 2

## 2024-03-04 MED ORDER — MIDAZOLAM HCL 2 MG/2ML IJ SOLN
INTRAMUSCULAR | Status: AC
  Filled 2024-03-04: qty 2

## 2024-03-04 MED ORDER — OXYCODONE HCL 5 MG/5ML PO SOLN: 5.0000 mg | Freq: Once | ORAL | Status: DC | PRN

## 2024-03-04 MED ORDER — BUPIVACAINE HCL (PF) 0.5 % IJ SOLN
INTRAMUSCULAR | Status: DC | PRN
  Administered 2024-03-04: 6 mL

## 2024-03-04 MED ORDER — LIDOCAINE HCL (PF) 1 % IJ SOLN
INTRAMUSCULAR | Status: AC
  Filled 2024-03-04: qty 30

## 2024-03-04 MED ORDER — BUPIVACAINE HCL (PF) 0.5 % IJ SOLN
INTRAMUSCULAR | Status: AC
  Filled 2024-03-04: qty 30

## 2024-03-04 MED ORDER — TRAMADOL HCL 50 MG PO TABS: 50.0000 mg | ORAL_TABLET | Freq: Four times a day (QID) | ORAL | 0 refills | Status: DC | PRN

## 2024-03-04 MED ORDER — FENTANYL CITRATE (PF) 100 MCG/2ML IJ SOLN: 25.0000 ug | INTRAMUSCULAR | Status: DC | PRN

## 2024-03-04 MED ORDER — EPHEDRINE 5 MG/ML INJ
INTRAVENOUS | Status: AC
  Filled 2024-03-04: qty 5

## 2024-03-04 MED ORDER — MIDAZOLAM HCL 2 MG/2ML IJ SOLN
INTRAMUSCULAR | Status: DC | PRN
  Administered 2024-03-04: 2 mg via INTRAVENOUS

## 2024-03-04 MED ORDER — PROPOFOL 10 MG/ML IV BOLUS
INTRAVENOUS | Status: DC | PRN
  Administered 2024-03-04: 100 ug/kg/min via INTRAVENOUS
  Administered 2024-03-04: 30 mg via INTRAVENOUS

## 2024-03-04 MED ORDER — LIDOCAINE 2% (20 MG/ML) 5 ML SYRINGE
INTRAMUSCULAR | Status: AC
  Filled 2024-03-04: qty 5

## 2024-03-04 MED ORDER — PROPOFOL 500 MG/50ML IV EMUL
INTRAVENOUS | Status: AC
  Filled 2024-03-04: qty 50

## 2024-03-04 MED ORDER — OXYCODONE HCL 5 MG PO TABS: 5.0000 mg | ORAL_TABLET | Freq: Once | ORAL | Status: DC | PRN

## 2024-03-04 MED ORDER — CEFAZOLIN SODIUM-DEXTROSE 2-4 GM/100ML-% IV SOLN
2.0000 g | INTRAVENOUS | Status: AC
  Administered 2024-03-04: 2 g via INTRAVENOUS

## 2024-03-04 MED ORDER — MEPERIDINE HCL 25 MG/ML IJ SOLN: 6.2500 mg | INTRAMUSCULAR | Status: DC | PRN

## 2024-03-04 MED ORDER — BUPIVACAINE-EPINEPHRINE (PF) 0.5% -1:200000 IJ SOLN
INTRAMUSCULAR | Status: AC
  Filled 2024-03-04: qty 30

## 2024-03-04 MED ORDER — ACETAMINOPHEN 500 MG PO TABS
ORAL_TABLET | ORAL | Status: AC
  Filled 2024-03-04: qty 2

## 2024-03-04 MED ORDER — LACTATED RINGERS IV SOLN: INTRAVENOUS | Status: DC

## 2024-03-04 MED ORDER — CEFAZOLIN SODIUM-DEXTROSE 2-4 GM/100ML-% IV SOLN
INTRAVENOUS | Status: AC
  Filled 2024-03-04: qty 100

## 2024-03-04 MED ORDER — EPHEDRINE SULFATE-NACL 50-0.9 MG/10ML-% IV SOSY
PREFILLED_SYRINGE | INTRAVENOUS | Status: DC | PRN
  Administered 2024-03-04: 10 mg via INTRAVENOUS

## 2024-03-04 MED ORDER — FENTANYL CITRATE (PF) 100 MCG/2ML IJ SOLN
INTRAMUSCULAR | Status: DC | PRN
  Administered 2024-03-04: 50 ug via INTRAVENOUS

## 2024-03-04 MED ORDER — ACETAMINOPHEN 500 MG PO TABS
1000.0000 mg | ORAL_TABLET | Freq: Once | ORAL | Status: AC
  Administered 2024-03-04: 1000 mg via ORAL

## 2024-03-04 MED ORDER — MIDAZOLAM HCL 2 MG/2ML IJ SOLN: 0.5000 mg | Freq: Once | INTRAMUSCULAR | Status: DC | PRN

## 2024-03-04 SURGICAL SUPPLY — 35 items
BLADE SURG 15 STRL LF DISP TIS (BLADE) ×2 IMPLANT
CANISTER SUCT 1200ML W/VALVE (MISCELLANEOUS) IMPLANT
CHLORAPREP W/TINT 26 (MISCELLANEOUS) ×2 IMPLANT
CLEANER CAUTERY TIP PAD (MISCELLANEOUS) IMPLANT
COVER BACK TABLE 60X90IN (DRAPES) ×2 IMPLANT
COVER MAYO STAND STRL (DRAPES) ×2 IMPLANT
DERMABOND ADVANCED .7 DNX12 (GAUZE/BANDAGES/DRESSINGS) ×2 IMPLANT
DRAPE LAPAROTOMY 100X72 PEDS (DRAPES) IMPLANT
DRAPE UTILITY XL STRL (DRAPES) ×2 IMPLANT
DRSG TEGADERM 4X4.75 (GAUZE/BANDAGES/DRESSINGS) IMPLANT
ELECTRODE REM PT RTRN 9FT ADLT (ELECTROSURGICAL) ×2 IMPLANT
GLOVE BIOGEL PI IND STRL 8 (GLOVE) IMPLANT
GLOVE SURG ORTHO 8.0 STRL STRW (GLOVE) ×2 IMPLANT
GLOVE SURG SYN 8.0 (GLOVE) ×1 IMPLANT
GLOVE SURG SYN 8.0 PF PI (GLOVE) IMPLANT
GOWN STRL REUS W/ TWL LRG LVL3 (GOWN DISPOSABLE) ×2 IMPLANT
GOWN STRL REUS W/ TWL XL LVL3 (GOWN DISPOSABLE) ×2 IMPLANT
NDL HYPO 25X1 1.5 SAFETY (NEEDLE) ×2 IMPLANT
NEEDLE HYPO 25X1 1.5 SAFETY (NEEDLE) ×1 IMPLANT
PACK BASIN DAY SURGERY FS (CUSTOM PROCEDURE TRAY) ×2 IMPLANT
PENCIL SMOKE EVACUATOR (MISCELLANEOUS) ×2 IMPLANT
SLEEVE SCD COMPRESS KNEE MED (STOCKING) IMPLANT
SPIKE FLUID TRANSFER (MISCELLANEOUS) IMPLANT
STRIP CLOSURE SKIN 1/2X4 (GAUZE/BANDAGES/DRESSINGS) IMPLANT
SUCTION TUBE FRAZIER 10FR DISP (SUCTIONS) IMPLANT
SUT ETHILON 3 0 PS 1 (SUTURE) IMPLANT
SUT ETHILON 4 0 PS 2 18 (SUTURE) IMPLANT
SUT MNCRL AB 3-0 PS2 18 (SUTURE) IMPLANT
SUT MNCRL AB 4-0 PS2 18 (SUTURE) IMPLANT
SUT VIC AB 4-0 PS2 18 (SUTURE) IMPLANT
SUT VICRYL 3-0 CR8 SH (SUTURE) ×2 IMPLANT
SYR CONTROL 10ML LL (SYRINGE) ×2 IMPLANT
TOWEL GREEN STERILE FF (TOWEL DISPOSABLE) ×4 IMPLANT
TUBE CONNECTING 20X1/4 (TUBING) IMPLANT
YANKAUER SUCT BULB TIP NO VENT (SUCTIONS) IMPLANT

## 2024-03-04 NOTE — Interval H&P Note (Signed)
 History and Physical Interval Note:  03/04/2024 7:05 AM  [name]  has presented today for surgery, with the diagnosis of EPIDERMAL CYST.  The various methods of treatment have been discussed with the patient and family. After consideration of risks, benefits and other options for treatment, the patient has consented to    Procedure(s) with comments: EXCISION, MASS, NECK (Right) - EXCISION CYST RIGHT LATERAL NECK LOCAL MAC as a surgical intervention.    The patient's history has been reviewed, patient examined, no change in status, stable for surgery.  I have reviewed the patient's chart and labs.  Questions were answered to the patient's satisfaction.    Oralee Billow, MD [facility] practice Office: [phone]   Oralee Billow

## 2024-03-04 NOTE — Transfer of Care (Signed)
 Immediate Anesthesia Transfer of Care Note  Patient: [name]  Procedure(s) Performed: Procedure(s) (LRB): EXCISION, MASS, NECK (Right)  Patient Location: PACU  Anesthesia Type: MAC  Level of Consciousness: awake, alert , oriented and patient cooperative  Airway & Oxygen Therapy: Patient Spontanous Breathing and Patient connected to face mask oxygen  Post-op Assessment: Report given to PACU RN and Post -op Vital signs reviewed and stable  Post vital signs: Reviewed and stable  Complications: No apparent anesthesia complications  Last Vitals:  Vitals Value Taken Time  BP 99/64 03/04/24 0812  Temp 36.2 C 03/04/24 0812  Pulse 54 03/04/24 0814  Resp 14 03/04/24 0814  SpO2 95 % 03/04/24 0814  Vitals shown include unfiled device data.  Last Pain:  Vitals:   03/04/24 0812  TempSrc:   PainSc: 0-No pain      Patients Stated Pain Goal: 0 (03/04/24 1610)  Complications: No notable events documented.

## 2024-03-04 NOTE — Anesthesia Procedure Notes (Signed)
 Procedure Name: MAC Date/Time: 03/04/2024 7:36 AM  Performed by: Glo Larch, CRNAPre-anesthesia Checklist: Patient identified, Emergency Drugs available, Suction available and Patient being monitored Oxygen Delivery Method: Simple face mask

## 2024-03-04 NOTE — Op Note (Signed)
 Operative Note  Pre-operative Diagnosis:  epidermal cyst right neck  Post-operative Diagnosis:  same  Surgeon:  Oralee Billow, MD  Assistant:  none   Procedure:  excision epidermal cyst right neck (1.5 x 1.0 x 0.5 cm, subcutaneous)  Anesthesia:  local with IV sedation  Estimated Blood Loss:  minimal  Drains: none         Specimen:  to pathology  Indications:  Patient is referred by her primary care provider, Gavin Kast, NP, for surgical evaluation and management of a epidermal cyst in the right lateral neck with recurrent episodes of infection. Patient first had an episode of infection at this location several years ago. It spontaneously drained. It was treated successfully of antibiotics. Patient recently had another episode of inflammation requiring antibiotics followed by spontaneous drainage. Patient is now referred to consider definitive surgical excision. She admits to significant medical anxiety. Patient has not had any other lesions except for many years ago a similar type lesion in the right groin which resolved completely.   Procedure:  The patient was seen in the pre-op holding area. The risks, benefits, complications, treatment options, and expected outcomes were previously discussed with the patient. The patient agreed with the proposed plan and has signed the informed consent form.  The patient was brought to the operating room by the surgical team, identified as [name] and the procedure verified. A "time out" was completed and the above information confirmed.  Following administration of intravenous sedation, the patient is positioned and then prepped and draped in the usual aseptic fashion.  After ascertaining that an adequate level sedation had been achieved, the site was anesthetized with local anesthetic.  An elliptical incision was made with a #15 blade so as to encompass the previous scar and the palpable mass in the subcutaneous tissues.  The excised tissue  measures approximately 1.5 x 1.0 x 0.5 cm and dissection is carried into the subcutaneous tissues.  Specimen is submitted to pathology.  Hemostasis is achieved with the electrocautery.  Subcutaneous tissues are closed with interrupted 3-0 Vicryl sutures.  Skin is closed with a running 4-0 Monocryl subcuticular suture.  Wound was washed and dried and Dermabond is applied as dressing.  Patient is awakened from sedation and transferred to the recovery room in stable condition.  The patient tolerated the procedure well.   Oralee Billow, MD [facility] Office: [phone]

## 2024-03-04 NOTE — Anesthesia Postprocedure Evaluation (Signed)
 Anesthesia Post Note  Patient: Salimata Waibel  Procedure(s) Performed: EXCISION, MASS, NECK (Right: Neck)     Patient location during evaluation: Phase II Anesthesia Type: MAC Level of consciousness: awake and alert, patient cooperative and oriented Pain management: pain level controlled Vital Signs Assessment: post-procedure vital signs reviewed and stable Respiratory status: spontaneous breathing, nonlabored ventilation and respiratory function stable Cardiovascular status: blood pressure returned to baseline and stable Postop Assessment: no apparent nausea or vomiting, able to ambulate and adequate PO intake Anesthetic complications: no   No notable events documented.  Last Vitals:  Vitals:   03/04/24 0815 03/04/24 0831  BP: 108/63 106/60  Pulse: (!) 53 (!) 52  Resp: 15 16  Temp:  (!) 36.3 C  SpO2: 95% 98%    Last Pain:  Vitals:   03/04/24 0831  TempSrc:   PainSc: 0-No pain                 Mouna Yager,E. Nicholai Willette

## 2024-03-04 NOTE — Discharge Instructions (Addendum)
  [facility] -- DISCHARGE INSTRUCTIONS  REMINDER:   Carry a list of your medications and allergies with you at all times  Call your pharmacy at least 1 week in advance to refill prescriptions  Do not mix any prescribed pain medicine with alcohol  Do not drive any motor vehicles while taking pain medication  Take medications with food unless otherwise directed  Follow-up appointments (date to return to physician): Please call [phone] to confirm your follow up appointment with your surgeon.  Call your Surgeon if you have:  Temperature greater than 101.0  Persistent nausea and vomiting  Severe uncontrolled pain  Redness, tenderness, or signs of infection (pain, swelling, redness, odor or green/yellow discharge around the site)  Difficulty breathing, headache or visual disturbances  Hives  Persistent dizziness or light-headedness  Any other questions or concerns you may have after discharge  In an emergency, call 911 or go to an Emergency Department at a nearby hospital.  Diet: Begin with liquids, and if they are tolerated, resume your usual diet.  Avoid spicy, greasy or heavy foods.  If you have nausea or vomiting, go back to liquids.  If you cannot keep liquids down, call your doctor.  Avoid alcohol consumption while on prescription pain medications. Good nutrition promotes healing. Increase fiber and fluids.   ADDITIONAL INSTRUCTIONS: Leave Dermabond in place for 7 to 10 days.  May shower immediately.  Ice packs to area the first day or two for comfort.  Prescription for pain medication is available at the pharmacy.  [facility] Office: [phone]  No Tylenol  before 12:30pm today.   Post Anesthesia Home Care Instructions  Activity: Get plenty of rest for the remainder of the day. A responsible individual must stay with you for 24 hours following the procedure.  For the next 24 hours, DO NOT: -Drive a car -Advertising copywriter -Drink alcoholic  beverages -Take any medication unless instructed by your physician -Make any legal decisions or sign important papers.  Meals: Start with liquid foods such as gelatin or soup. Progress to regular foods as tolerated. Avoid greasy, spicy, heavy foods. If nausea and/or vomiting occur, drink only clear liquids until the nausea and/or vomiting subsides. Call your physician if vomiting continues.  Special Instructions/Symptoms: Your throat may feel dry or sore from the anesthesia or the breathing tube placed in your throat during surgery. If this causes discomfort, gargle with warm salt water. The discomfort should disappear within 24 hours.  If you had a scopolamine patch placed behind your ear for the management of post- operative nausea and/or vomiting:  1. The medication in the patch is effective for 72 hours, after which it should be removed.  Wrap patch in a tissue and discard in the trash. Wash hands thoroughly with soap and water. 2. You may remove the patch earlier than 72 hours if you experience unpleasant side effects which may include dry mouth, dizziness or visual disturbances. 3. Avoid touching the patch. Wash your hands with soap and water after contact with the patch.

## 2024-03-05 ENCOUNTER — Encounter (HOSPITAL_BASED_OUTPATIENT_CLINIC_OR_DEPARTMENT_OTHER): Payer: Self-pay | Admitting: Surgery

## 2024-03-07 LAB — SURGICAL PATHOLOGY

## 2024-03-08 ENCOUNTER — Ambulatory Visit: Payer: Self-pay | Admitting: Surgery

## 2024-03-08 NOTE — Progress Notes (Signed)
 Path is benign, as expected.  Alicia Level, MD [facility] practice Office: [phone]

## 2024-05-04 ENCOUNTER — Ambulatory Visit: Admitting: Internal Medicine

## 2024-05-18 ENCOUNTER — Encounter: Payer: Self-pay | Admitting: Internal Medicine

## 2024-05-18 ENCOUNTER — Ambulatory Visit: Admitting: Internal Medicine

## 2024-05-18 VITALS — BP 122/70 | HR 75 | Temp 98.0°F | Ht 67.0 in | Wt 150.6 lb

## 2024-05-18 DIAGNOSIS — F419 Anxiety disorder, unspecified: Secondary | ICD-10-CM

## 2024-05-18 DIAGNOSIS — M5431 Sciatica, right side: Secondary | ICD-10-CM

## 2024-05-18 DIAGNOSIS — Z9109 Other allergy status, other than to drugs and biological substances: Secondary | ICD-10-CM

## 2024-05-18 MED ORDER — SERTRALINE HCL 50 MG PO TABS
50.0000 mg | ORAL_TABLET | Freq: Every day | ORAL | 3 refills | Status: AC
Start: 2024-05-18 — End: ?

## 2024-05-18 NOTE — Progress Notes (Signed)
 [facility] [address] [place] [address] Dept: [phone] Dept Fax: [phone]    Subjective:   [name] 1975-12-15 05/18/2024  Chief Complaint  Patient presents with   Follow-up    Discuss nasal concerns     HPI: [name] presents today for re-assessment and management of chronic medical conditions.  Discussed the use of AI scribe software for clinical note transcription with the patient, who gave verbal consent to proceed.  History of Present Illness   [name] is a 48 year old female who presents for follow up on anxiety and allergies.   She has been taking Zoloft  50 mg once daily, which has significantly improved her anxiety symptoms. Her anxiety was initially triggered by a friend's cancer diagnosis. No thoughts of self-harm or harm to others. She requests a refill for her Zoloft  prescription.  She has ongoing nasal concerns, specifically postnasal drip that began in June of the previous year. She experiences constant drainage and uses azelastine  nasal spray to manage symptoms, which helps dry up secretions quickly. She recalls an allergy test as a child indicating dust mite allergies but does not remember specifics. Symptoms worsen when she returns to [place]. Her daughter experiences similar symptoms when at home. She wants to identify specific allergens.         05/18/2024   10:49 AM 05/18/2024    9:05 AM 01/18/2024    9:18 AM  Depression screen PHQ 2/9  Decreased Interest 0 0 0  Down, Depressed, Hopeless 0 0 0  PHQ - 2 Score 0 0 0  Altered sleeping 0  1  Tired, decreased energy 0  1  Change in appetite 0  1  Feeling bad or failure about yourself  0  0  Trouble concentrating 0  0  Moving slowly or fidgety/restless 0  0  Suicidal thoughts 0  0  PHQ-9 Score 0  3  Difficult doing work/chores Not difficult at all  Not difficult at all      The following portions of the  patient's history were reviewed and updated as appropriate: past medical history, past surgical history, family history, social history, allergies, medications, and problem list.   Patient Active Problem List   Diagnosis Date Noted   Epidermal cyst of neck 02/29/2024   Post-nasal drip 09/29/2023   Anxiety 09/29/2023   Past Medical History:  Diagnosis Date   Allergy    Anxiety    Fracture, humerus, shaft 09/12/2011   GERD (gastroesophageal reflux disease)    Past Surgical History:  Procedure Laterality Date   AUGMENTATION MAMMAPLASTY     EXCISION MASS NECK Right 03/04/2024   Procedure: EXCISION, MASS, NECK;  Surgeon: Eletha Boas, MD;  Location: [facility];  Service: General;  Laterality: Right;  EXCISION CYST RIGHT LATERAL NECK LOCAL MAC   FRACTURE SURGERY     Arm   LITHOTRIPSY     ORIF HUMERUS FRACTURE  09/12/2011   Procedure: OPEN REDUCTION INTERNAL FIXATION (ORIF) PROXIMAL HUMERUS FRACTURE;  Surgeon: Oneil JAYSON Herald;  Location: [facility] OR;  Service: Orthopedics;  Laterality: Left;  Open Reduction Internal Fixation Left Humerus   TONSILLECTOMY     Family History  Problem Relation Age of Onset   Rheum arthritis Mother    Diabetes Mother    Heart failure Mother    Cancer Maternal Grandmother    Diabetes Maternal Uncle    Breast cancer Neg Hx     Current Outpatient Medications:  azelastine  (ASTELIN ) 0.1 % nasal spray, Place 1 spray into both nostrils 2 (two) times daily., Disp: 30 mL, Rfl: 3   levonorgestrel (MIRENA) 20 MCG/24HR IUD, 1 each by Intrauterine route once., Disp: , Rfl:    sertraline  (ZOLOFT ) 50 MG tablet, Take 1 tablet (50 mg total) by mouth daily., Disp: 90 tablet, Rfl: 3 Allergies  Allergen Reactions   Aspirin Other (See Comments)    Upset stomach     ROS: A complete ROS was performed with pertinent positives/negatives noted in the HPI. The remainder of the ROS are negative.    Objective:   Today's Vitals   05/18/24 0906  BP: 122/70   Pulse: 75  Temp: 98 F (36.7 C)  TempSrc: Temporal  SpO2: 98%  Weight: 150 lb 9.6 oz (68.3 kg)  Height: 5' 7 (1.702 m)    GENERAL: Well-appearing, in NAD. Well nourished.  SKIN: Pink, warm and dry. No rash, lesion, ulceration, or ecchymoses.  NECK: Trachea midline. Full ROM w/o pain or tenderness. No lymphadenopathy.  RESPIRATORY: Chest wall symmetrical. Respirations even and non-labored. Breath sounds clear to auscultation bilaterally.  CARDIAC: S1, S2 present, regular rate and rhythm. Peripheral pulses 2+ bilaterally.  EXTREMITIES: Without clubbing, cyanosis, or edema.  NEUROLOGIC: No motor or sensory deficits. Steady, even gait.  PSYCH/MENTAL STATUS: Alert, oriented x 3. Cooperative, appropriate mood and affect.   Health Maintenance Due  Topic Date Due   Hepatitis B Vaccines (1 of 3 - 19+ 3-dose series) Never done   Colonoscopy  Never done    No results found for any visits on 05/18/24.  The 10-year ASCVD risk score (Arnett DK, et al., 2019) is: 0.9%     Assessment & Plan:  Assessment and Plan    Anxiety Anxiety improved with sertraline  50 mg daily. No self-harm thoughts.  - Continue sertraline  50 mg daily. - Send prescription refill for sertraline  to [facility] off of [address].  Environmental Allergies Chronic postnasal drip likely due to environmental allergies. Symptoms improve with azelastine . Referral for allergy testing planned. - Continue azelastine  nasal spray. - [facility].  General Health Maintenance Physical exam due next year. Last exam on January 18, 2024. - Schedule follow-up physical exam after January 18, 2024.       Orders Placed This Encounter  Procedures   Ambulatory referral to Allergy    Referral Priority:   Routine    Referral Type:   Allergy Testing    Referral Reason:   Specialty Services Required    Requested Specialty:   Allergy    Number of Visits Requested:   1   No images are attached  to the encounter or orders placed in the encounter. Meds ordered this encounter  Medications   sertraline  (ZOLOFT ) 50 MG tablet    Sig: Take 1 tablet (50 mg total) by mouth daily.    Dispense:  90 tablet    Refill:  3    Supervising Provider:   THOMPSON, AARON B [[id number]]    Return in about 9 months (around 02/16/2025) for Annual Physical Exam with fasting lab work.   Rosina Senters, FNP

## 2024-05-19 ENCOUNTER — Encounter: Payer: Self-pay | Admitting: Internal Medicine
